# Patient Record
Sex: Male | Born: 1990 | Race: White | Hispanic: No | Marital: Single | Smoking: Current every day smoker
Health system: Southern US, Community
[De-identification: ages and names within clinical notes are randomized; demographics above are authoritative.]

## PROBLEM LIST (undated history)

## (undated) DIAGNOSIS — F1120 Opioid dependence, uncomplicated: Secondary | ICD-10-CM

## (undated) DIAGNOSIS — D649 Anemia, unspecified: Secondary | ICD-10-CM

## (undated) DIAGNOSIS — M779 Enthesopathy, unspecified: Secondary | ICD-10-CM

## (undated) HISTORY — PX: WISDOM TOOTH EXTRACTION: SHX21

---
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
## 2010-03-28 ENCOUNTER — Ambulatory Visit: Payer: Self-pay | Admitting: Internal Medicine

## 2010-03-28 DIAGNOSIS — D492 Neoplasm of unspecified behavior of bone, soft tissue, and skin: Secondary | ICD-10-CM | POA: Insufficient documentation

## 2010-03-28 DIAGNOSIS — J45909 Unspecified asthma, uncomplicated: Secondary | ICD-10-CM | POA: Insufficient documentation

## 2010-04-03 ENCOUNTER — Telehealth (INDEPENDENT_AMBULATORY_CARE_PROVIDER_SITE_OTHER): Payer: Self-pay | Admitting: *Deleted

## 2010-04-06 ENCOUNTER — Encounter (INDEPENDENT_AMBULATORY_CARE_PROVIDER_SITE_OTHER): Payer: Self-pay | Admitting: *Deleted

## 2010-06-02 ENCOUNTER — Ambulatory Visit: Payer: Self-pay | Admitting: Internal Medicine

## 2010-06-16 ENCOUNTER — Ambulatory Visit: Payer: Self-pay | Admitting: Family Medicine

## 2010-06-16 DIAGNOSIS — J029 Acute pharyngitis, unspecified: Secondary | ICD-10-CM | POA: Insufficient documentation

## 2010-06-16 LAB — CONVERTED CEMR LAB
Heterophile Ab Screen: POSITIVE
Rapid Strep: NEGATIVE

## 2010-06-21 ENCOUNTER — Encounter (INDEPENDENT_AMBULATORY_CARE_PROVIDER_SITE_OTHER): Payer: Self-pay | Admitting: *Deleted

## 2010-07-05 ENCOUNTER — Encounter: Payer: Self-pay | Admitting: Internal Medicine

## 2010-07-18 ENCOUNTER — Telehealth: Payer: Self-pay | Admitting: Internal Medicine

## 2010-09-10 ENCOUNTER — Telehealth: Payer: Self-pay | Admitting: Internal Medicine

## 2010-09-11 ENCOUNTER — Encounter (INDEPENDENT_AMBULATORY_CARE_PROVIDER_SITE_OTHER): Payer: Self-pay | Admitting: *Deleted

## 2010-09-19 ENCOUNTER — Ambulatory Visit: Payer: Self-pay | Admitting: Internal Medicine

## 2010-09-19 ENCOUNTER — Encounter (INDEPENDENT_AMBULATORY_CARE_PROVIDER_SITE_OTHER): Payer: Self-pay | Admitting: *Deleted

## 2010-09-19 ENCOUNTER — Telehealth (INDEPENDENT_AMBULATORY_CARE_PROVIDER_SITE_OTHER): Payer: Self-pay | Admitting: *Deleted

## 2010-11-21 NOTE — Assessment & Plan Note (Signed)
Summary: congested/cbs   Vital Signs:  Patient profile:   20 year old male Height:      74 inches Weight:      170 pounds O2 Sat:      91 % on Room air Temp:     98.3 degrees F oral Pulse rate:   96 / minute BP sitting:   120 / 82  (left arm)  Vitals Entered By: Jeremy Johann CMA (March 28, 2010 8:43 AM)  O2 Flow:  Room air CC: chest congestion, Comments --cough --chest tightness --difficulty breathing REVIEWED MED LIST, PATIENT AGREED DOSE AND INSTRUCTION CORRECT    History of Present Illness: developed  chest congestion, wheezing, shortness of breath and cough few days ago he is using his old albuterol nebulization and inhalers Symptoms somehow better today but he is still pretty tight  He just moved from [place] back to [place], while living there, they detected a tumor at the left knee, 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 Needs a followup. He has some knee pain, his grandma has been giving him Ultram that he takes p.r.n.  Allergies (verified): No Known Drug Allergies  Past History:  Past Medical History: Asthma L knee tumor (distal femur)  Past Surgical History: no major   Family History: colon ca-- no prostate ca--no  Social History: moved back to [place] 3-11 went to [employer] x 2 years) tobacco 1/2 ppd ETOH- sometimes Drugs -- no  Review of Systems General:  Denies fever; no muscle aches or fatigue his asthma symptoms have been well controlled, last respiratory problems almost a year ago. ENT:  some runny nose, no sore throat, no sinus congestion. GI:  Denies diarrhea, nausea, and vomiting.  Physical Exam  General:  alert, well-developed, and well-nourished.   Ears:  R ear normal and L ear normal.   Nose:  slightly congested Mouth:  no redness or discharge Lungs:  he has bilateral wheezing, prolonged expiration, few rhonchi. No acute distress Heart:  normal rate, regular rhythm, and no murmur.   Extremities:  no edema   Impression &  Recommendations:  Problem # 1:  DISEASE, ACUTE BRONCHOSPASM (ICD-519.11) Assessment New acute exacerbation of asthma Nebulization provided today, exam following the nebulization ---> improved. Good air movement , decreased wheezing, few rhonchi see Instructions  Problem # 2:  ASTHMA (ICD-493.90) symptoms well controlled except for very few exacerbations, see review of systems His updated medication list for this problem includes:    Albuterol Sulfate 0.63 Mg/80ml Nebu (Albuterol sulfate) .Marland Kitchen... As directed    Ventolin Hfa 108 (90 Base) Mcg/act Aers (Albuterol sulfate) .Marland Kitchen... 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 Was told he needs a followup x-ray. recommended  to bring the old records X-ray of the left knee Ortho  referral if   needed    Orders: T-Knee Comp Left 4 Views [phone])  Complete Medication List: 1)  Albuterol Sulfate 0.63 Mg/15ml Nebu (Albuterol sulfate) .... As directed 2)  Ventolin Hfa 108 (90 Base) Mcg/act Aers (Albuterol sulfate) .... 2 puffs every 4 hours as needed for wheezing 3)  Zithromax Z-pak 250 Mg Tabs (Azithromycin) .... As directed 4)  Prednisone 10 Mg Tabs (Prednisone) .Marland KitchenMarland KitchenMarland Kitchen  4 by mouth once daily x 2, 3x2, 2x2,1x2  Patient Instructions: 1)  rest, fluids, Tylenol, Robitussin-DM 2)  Albuterol nebulization or inhalers as needed for wheezing 3)  Antibiotics----Zithromax 4)  Prednisone as prescribed 5)  Call if not better in a few days, call anytime if you feel worse Prescriptions: PREDNISONE 10 MG TABS (PREDNISONE) 4 by mouth once daily x 2, 3x2, 2x2,1x2  #20 x 0   Entered and Authorized by:   Elita Quick E. Paz MD   Signed by:   Nolon Rod. Paz MD on 03/28/2010   Method used:    Print then Give to Patient   RxID:   [id number] ZITHROMAX Z-PAK 250 MG TABS (AZITHROMYCIN) as directed  #1 x 0   Entered and Authorized by:   Nolon Rod. Paz MD   Signed by:   Nolon Rod. Paz MD on 03/28/2010   Method used:   Print then Give to Patient   RxID:   [id number] VENTOLIN HFA 108 (90 BASE) MCG/ACT AERS (ALBUTEROL SULFATE) 2 puffs every 4 hours as needed for wheezing  #1 x 1   Entered and Authorized by:   Nolon Rod. Paz MD   Signed by:   Nolon Rod. Paz MD on 03/28/2010   Method used:   Print then Give to Patient   RxID:   [id number]    Medication Administration  Medication # 1:    Medication: Albuterol Sulfate Sol 1mg  unit dose    Diagnosis: ASTHMA (ICD-493.90)    Route: inhaled    Exp Date: 12/21/2010    Lot #: [id number]    Mfr: nephron pharmaceuticals     Comments: pt given 2.5/23ml    Patient tolerated medication without complications    Given by: Jeremy Johann CMA (March 28, 2010 9:22 AM)  Orders Added: 1)  Nebulizer Tx [25366] 2)  Albuterol Sulfate Sol 1mg  unit dose [J7613] 3)  T-Knee Comp Left 4 Views [73564TC] 4)  Est. Patient Level IV [44034]

## 2010-11-21 NOTE — Letter (Signed)
Summary: Primary Care Appointment Letter  [facility]  [address] [place], [place] [address]   Phone: [phone]  Fax: [phone]    09/11/2010 MRN: [id number]  [name] [address] [place], [place]  [address]  Dear Mr. [name],   Your Primary Care Physician [place] E. Paz MD has indicated that:    ___X____it is time to schedule an appointment. (Please contact our office, need to repeat left knee Xray)    _______you missed your appointment on______ and need to call and          reschedule.    _______you need to have lab work done.    _______you need to schedule an appointment discuss lab or test results.    _______you need to call to reschedule your appointment that is                       scheduled on _________.     Please call our office as soon as possible. Our phone number is [phone]_________. Please press option 1. Our office is open 8a-12noon and 1p-5p, Monday through Friday.     Thank you,    [place] Primary [facility] Scheduler

## 2010-11-21 NOTE — Letter (Signed)
Summary: Out of Work  [facility]  [address] [place], [place] [address]   Phone: [phone]  Fax: [phone]    June 16, 2010   Employee:  [name]    To Whom It May Concern:   For Medical reasons, please excuse the above named employee from work for the following dates:  Start:   8/25  End:   Once he is fever free for 24 hours.  He has mono and is not able to work until his temp is normal for 24 hours.  Once his temp is normal he may have difficulty completing a full shift due to the fatigue that mono causes.  Please be understanding during this illness.  If you need additional information, please feel free to contact our office.         Sincerely,    Neena Rhymes MD

## 2010-11-21 NOTE — Progress Notes (Signed)
Summary: needs a knee x-ray  Phone Note Outgoing Call   Summary of Call: please advise patient that we're still waiting for the x-ray of  his  knee. If he needs a new  order, please print another copy Jose E. Paz MD  April 03, 2010 10:49 AM   left message to call office.Marti Sleigh Deloach CMA  April 03, 2010 4:50 PM  left message to call  office................Marland KitchenFelecia Deloach CMA  April 04, 2010 9:30 AM  [facility] OFFICE.....................Marland KitchenFelecia Deloach CMA  April 05, 2010 10:27 AM  tried to call pt several time with no response letter mailed to pt to contact office  .............Marland KitchenFelecia Deloach CMA  April 06, 2010 2:30 PM

## 2010-11-21 NOTE — Letter (Signed)
Summary: Work Dietitian at [employer]  [address] [place], [place] [address]   Phone: [phone]  Fax: [phone]    Today's Date: September 19, 2010  Name of Patient: [name]  The above named patient had a medical visit today at:  10:15am / pm.  Please take this into consideration when reviewing the time away from work/school.    Special Instructions:  [ X ] None  [  ] To be off the remainder of today, returning to the normal work / school schedule tomorrow.  [  ] To be off until the next scheduled appointment on ______________________.  [  ] Other ________________________________________________________________ ________________________________________________________________________   Sincerely yours,   Shonna Chock CMA

## 2010-11-21 NOTE — Assessment & Plan Note (Signed)
Summary: tetnus, flu//lch  Nurse Visit  CC: TD and flu vacc/kb   Allergies: No Known Drug Allergies  Immunizations Administered:  Tetanus Vaccine:    Vaccine Type: Tdap    Site: left deltoid    Mfr: GlaxoSmithKline    Dose: 0.5 ml    Route: IM    Given by: Lucious Groves CMA    Exp. Date: 08/10/2012    Lot #: [id number]    VIS given: 09/08/08 version given September 19, 2010.  Orders Added: 1)  Admin 1st Vaccine [90471] 2)  Flu Vaccine 23yrs + [10932] 3)  Tdap => 50yrs IM [90715] 4)  Admin of Any Addtl Vaccine [90472]       Flu Vaccine Consent Questions     Do you have a history of severe allergic reactions to this vaccine? no    Any prior history of allergic reactions to egg and/or gelatin? no    Do you have a sensitivity to the preservative Thimersol? no    Do you have a past history of Guillan-Barre Syndrome? no    Do you currently have an acute febrile illness? no    Have you ever had a severe reaction to latex? no    Vaccine information given and explained to patient? yes    Are you currently pregnant? no    Lot Number:[id number]   Exp Date:04/21/2011   Site Given  Right Deltoid IMu

## 2010-11-21 NOTE — Letter (Signed)
Summary: Generic Letter  [facility]  [address] [place], [place] [address]   Phone: [phone]  Fax: [phone]    04/06/2010  SHERIDAN GETTEL [address] [place], [place]  [address]  Dear Mr. Pierro,  We have tried several times to contact you with no response. Please give [place] a call at [phone] to discuss this matter.         Sincerely,       Jose Paz,MD

## 2010-11-21 NOTE — Progress Notes (Signed)
Summary: knee XR  Phone Note Outgoing Call   Summary of Call: please call patient : need repeated L Knee XR if unable to communicate, send letter [facility] Grayling E. Paz MD  September 10, 2010 12:15 PM   Follow-up for Phone Call        Mailbox full, will send letter.  Follow-up by: Army Fossa CMA,  September 11, 2010 8:47 AM

## 2010-11-21 NOTE — Progress Notes (Signed)
Summary: refill  Phone Note Refill Request Call back at [phone] Message from:  Patient on September 19, 2010 10:39 AM  Refills Requested: Medication #1:  ALBUTEROL SULFATE 0.63 MG/3ML NEBU as directed   Dosage confirmed as above?Dosage Confirmed   Supply Requested: 1 month [facility]  Next Appointment Scheduled: none Initial call taken by: Lavell Islam,  September 19, 2010 10:40 AM    Prescriptions: ALBUTEROL SULFATE 0.63 MG/3ML NEBU (ALBUTEROL SULFATE) as directed  #1 month x 3   Entered by:   Army Fossa CMA   Authorized by:   Nolon Rod. Paz MD   Signed by:   Army Fossa CMA on 09/19/2010   Method used:   Electronically to        [facility]* (retail)       [address]       [place], [place]  [id number]       Ph: [phone]       Fax: [phone]   RxID:   [id number]

## 2010-11-21 NOTE — Letter (Signed)
Summary: Patient Will Not Return Calls/[facility]  Patient Will Not Return Calls/[facility]   Imported By: Lanelle Bal 07/25/2010 13:12:31  _____________________________________________________________________  External Attachment:    Type:   Image     Comment:   External Document

## 2010-11-21 NOTE — Progress Notes (Signed)
Summary: ORTHOPAEDIC REFERRAL  Phone Note Other Incoming   Summary of Call: IN REFERENCE TO ORTHOPAEDIC REFERRAL........Marland KitchenPER FAX FROM [facility], PATIENT WILL NOT RETURN THEIR CALLS Magdalen Spatz [facility]  July 05, 2010 10:48 AM  I HAVE LM FOR PT TO CALL ME ABOUT [facility] HIS APPT.  Initial call taken by: Magdalen Spatz [facility],  July 18, 2010 4:36 PM

## 2010-11-21 NOTE — Assessment & Plan Note (Signed)
Summary: SORE THROAT/FEVER/THROWING UP//KN   Vital Signs:  Patient profile:   20 year old male Height:      74 inches Weight:      170 pounds BMI:     21.91 Temp:     101.3 degrees F oral BP sitting:   112 / 70  (left arm)  Vitals Entered By: Doristine Devoid CMA (June 16, 2010 9:00 AM) CC: sore throat, vomiting and fever x2 days    History of Present Illness: [name] here today for sore throat, fever, and vomiting.  also wheezing, hx of asthma.  sxs started yesterday.  no known sick contacts.  vomited x3.  took grandmother's left over Zofran.  unable to eat due to pain of sore throat.  Tmax unknown- fever subjective at home.  works in [employer]nel [employer].  denies abd pain  Current Medications (verified): 1)  Albuterol Sulfate 0.63 Mg/24ml Nebu (Albuterol Sulfate) .... As Directed 2)  Ventolin Hfa 108 (90 Base) Mcg/act Aers (Albuterol Sulfate) .... 2 Puffs Every 4 Hours As Needed For Wheezing  Allergies (verified): No Known Drug Allergies  Past History:  Past Medical History: Last updated: 03/28/2010 Asthma L knee tumor (distal femur)  Social History: Last updated: 03/28/2010 moved back to [place] 3-11 went to [employer] x 2 years) tobacco 1/2 ppd ETOH- sometimes Drugs -- no  Review of Systems      See HPI  Physical Exam  General:  alert, well-developed, and well-nourished.   Head:  NCAT, no TTP over sinuses Eyes:  no injxn or inflammation Ears:  R ear normal and L ear normal.   Nose:  slightly congested Mouth:  marked tonsilar edema, erythema and exudates.  has muffled voice Neck:  mild ant LAD Lungs:  scattered expiratory wheezes Heart:  normal rate, regular rhythm, and no murmur.   Abdomen:  no hepatosplenomegaly, NT/ND, +BS   Impression & Recommendations:  Problem # 1:  SORE THROAT (ICD-462) Assessment New pt w/ + monospot.  start steroids for marked tonsilar enlargement.  Zofran as needed for nausea.  reviewed dx and supportive care.  note provided for  work. The following medications were removed from the medication list:    Zithromax Z-pak 250 Mg Tabs (Azithromycin) .Marland Kitchen... As directed  Orders: Rapid Strep (04540) Monospot (98119) Depo- Medrol 80mg  (J1040) Admin of Therapeutic Inj  intramuscular or subcutaneous (14782)  Complete Medication List: 1)  Albuterol Sulfate 0.63 Mg/55ml Nebu (Albuterol sulfate) .... As directed 2)  Ventolin Hfa 108 (90 Base) Mcg/act Aers (Albuterol sulfate) .... 2 puffs every 4 hours as needed for wheezing 3)  Prednisone 20 Mg Tabs (Prednisone) .... 2 tabs by mouth daily x10 days 4)  Zofran 8 Mg Tabs (Ondansetron hcl) .Marland Kitchen.. 1 by mouth q 8h prn  Patient Instructions: 1)  Follow up as needed 2)  This is mono and will take a while for you to feel better 3)  Get the rest that your body needs 4)  NO contact sports 5)  Take the Prednisone to help your sore throat and wheezing 6)  Use the Zofran as needed for nausea/vomiting 7)  Tylenol/Ibuprofen as needed for pain/fever 8)  Hang in there!! Prescriptions: ZOFRAN 8 MG  TABS (ONDANSETRON HCL) 1 by mouth q 8h prn  #45 x 0   Entered and Authorized by:   Neena Rhymes MD   Signed by:   Neena Rhymes MD on 06/16/2010   Method used:   Electronically to  [facility]* (retail)       [address]       [place], [place]  [id number]       Ph: [phone]       Fax: [phone]   RxID:   [id number] VENTOLIN HFA 108 (90 BASE) MCG/ACT AERS (ALBUTEROL SULFATE) 2 puffs every 4 hours as needed for wheezing  #1 x 3   Entered and Authorized by:   Neena Rhymes MD   Signed by:   Neena Rhymes MD on 06/16/2010   Method used:   Electronically to        [facility]* (retail)       [address]       [place], [place]  [address]       Ph: [phone]       Fax: [phone]   RxID:   [id number] PREDNISONE 20 MG TABS (PREDNISONE) 2 tabs by mouth daily x10 days  #20 x 0   Entered and Authorized by:   Neena Rhymes  MD   Signed by:   Neena Rhymes MD on 06/16/2010   Method used:   Electronically to        [facility]* (retail)       [address]       [place], [place]  [address]       Ph: [phone]       Fax: [phone]   RxID:   [id number]   Laboratory Results   Blood Tests      Mono: positive   Other Tests  Rapid Strep: negative  Kit Test Internal QC: Positive   (Normal Range: Negative)    Medication Administration  Injection # 1:    Medication: Depo- Medrol 80mg     Diagnosis: SORE THROAT (ICD-462)    Route: IM    Site: R deltoid    Exp Date: 08/22/2010    Lot #: [id number]    Mfr: [employer]    Given by: Doristine Devoid CMA (June 16, 2010 9:44 AM)  Orders Added: 1)  Rapid Strep [00938] 2)  Monospot [18299] 3)  Depo- Medrol 80mg  [J1040] 4)  Admin of Therapeutic Inj  intramuscular or subcutaneous [96372] 5)  Est. Patient Level III [37169]

## 2010-11-21 NOTE — Letter (Signed)
Summary: Generic Letter  [facility] [place]  [address] [place], [place] [address]   Phone: [phone]  Fax: [phone]    06/21/2010  [name] [address] [place], [place]  [address]  Dear Mr. Larch,   We have made mutiple attempts to get in contact with you. It seems the number we have goes straight a mailbox that is full. Please contact us with a new number to be able to reach you. We have some information we need to share you with. We also have some information you need to pick up for your futhure orthopeadic appointment. Please call us back at [phone] as soon as you get this letter. Thanks.      Sincerely,   Harold Barban

## 2011-02-18 ENCOUNTER — Emergency Department (HOSPITAL_COMMUNITY)
Admission: EM | Admit: 2011-02-18 | Discharge: 2011-02-19 | Disposition: A | Discharge status: Other Acute Inpatient Hospital | Payer: PRIVATE HEALTH INSURANCE | Attending: Emergency Medicine | Admitting: Emergency Medicine

## 2011-02-18 DIAGNOSIS — F1210 Cannabis abuse, uncomplicated: Secondary | ICD-10-CM | POA: Insufficient documentation

## 2011-02-18 DIAGNOSIS — F17200 Nicotine dependence, unspecified, uncomplicated: Secondary | ICD-10-CM | POA: Insufficient documentation

## 2011-02-18 DIAGNOSIS — R110 Nausea: Secondary | ICD-10-CM | POA: Insufficient documentation

## 2011-02-18 DIAGNOSIS — F1110 Opioid abuse, uncomplicated: Secondary | ICD-10-CM | POA: Insufficient documentation

## 2011-02-18 LAB — COMPREHENSIVE METABOLIC PANEL
ALT: 59 U/L — ABNORMAL HIGH (ref 0–53)
AST: 42 U/L — ABNORMAL HIGH (ref 0–37)
Albumin: 3.3 g/dL — ABNORMAL LOW (ref 3.5–5.2)
Alkaline Phosphatase: 105 U/L (ref 39–117)
BUN: 9 mg/dL (ref 6–23)
CO2: 32 mEq/L (ref 19–32)
Calcium: 9.5 mg/dL (ref 8.4–10.5)
Chloride: 102 mEq/L (ref 96–112)
Creatinine, Ser: 0.83 mg/dL (ref 0.4–1.5)
GFR calc Af Amer: >60 mL/min (ref >60)
GFR calc non Af Amer: >60 mL/min (ref >60)
Glucose, Bld: 94 mg/dL (ref 70–99)
Potassium: 4.7 mEq/L (ref 3.5–5.1)
Sodium: 140 mEq/L (ref 135–145)
Total Bilirubin: 0.5 mg/dL (ref 0.3–1.2)
Total Protein: 7.1 g/dL (ref 6.0–8.3)

## 2011-02-18 LAB — CBC
HCT: 39.5 % (ref 39.0–52.0)
Hemoglobin: 13.7 g/dL (ref 13.0–17.0)
MCH: 28.9 pg (ref 26.0–34.0)
MCHC: 34.7 g/dL (ref 30.0–36.0)
MCV: 83.3 fL (ref 78.0–100.0)
Platelets: 171 10*3/uL (ref 150–400)
RBC: 4.74 MIL/uL (ref 4.22–5.81)
RDW: 11.8 % (ref 11.5–15.5)
WBC: 5.2 10*3/uL (ref 4.0–10.5)

## 2011-02-18 LAB — RAPID URINE DRUG SCREEN, HOSP PERFORMED
Amphetamines: NOT DETECTED
Benzodiazepines: POSITIVE — AB
Cocaine: NOT DETECTED

## 2011-02-18 LAB — DIFFERENTIAL
Basophils Absolute: 0 10*3/uL (ref 0.0–0.1)
Basophils Relative: 0 % (ref 0–1)
Eosinophils Absolute: 0.2 10*3/uL (ref 0.0–0.7)
Eosinophils Relative: 3 % (ref 0–5)
Lymphocytes Relative: 29 % (ref 12–46)
Lymphs Abs: 1.5 10*3/uL (ref 0.7–4.0)
Monocytes Absolute: 0.4 10*3/uL (ref 0.1–1.0)
Monocytes Relative: 8 % (ref 3–12)
Neutro Abs: 3.1 10*3/uL (ref 1.7–7.7)
Neutrophils Relative %: 60 % (ref 43–77)

## 2011-02-18 LAB — ETHANOL: Alcohol, Ethyl (B): <5 mg/dL (ref 0–10)

## 2011-02-19 ENCOUNTER — Inpatient Hospital Stay (HOSPITAL_COMMUNITY)
Admission: AD | Admit: 2011-02-19 | Discharge: 2011-02-22 | DRG: 897 | Disposition: A | Discharge status: Home / Self Care | Payer: PRIVATE HEALTH INSURANCE | Source: Ambulatory Visit | Attending: Psychiatry | Admitting: Psychiatry

## 2011-02-19 DIAGNOSIS — F1120 Opioid dependence, uncomplicated: Principal | ICD-10-CM

## 2011-02-19 DIAGNOSIS — F329 Major depressive disorder, single episode, unspecified: Secondary | ICD-10-CM

## 2011-02-19 DIAGNOSIS — F411 Generalized anxiety disorder: Secondary | ICD-10-CM

## 2011-02-19 DIAGNOSIS — Z818 Family history of other mental and behavioral disorders: Secondary | ICD-10-CM

## 2011-02-19 DIAGNOSIS — F1910 Other psychoactive substance abuse, uncomplicated: Secondary | ICD-10-CM

## 2011-02-19 DIAGNOSIS — F3289 Other specified depressive episodes: Secondary | ICD-10-CM

## 2011-02-19 DIAGNOSIS — M25539 Pain in unspecified wrist: Secondary | ICD-10-CM

## 2011-02-19 DIAGNOSIS — I252 Old myocardial infarction: Secondary | ICD-10-CM

## 2011-02-19 DIAGNOSIS — M779 Enthesopathy, unspecified: Secondary | ICD-10-CM

## 2011-02-19 DIAGNOSIS — J45909 Unspecified asthma, uncomplicated: Secondary | ICD-10-CM

## 2011-02-19 DIAGNOSIS — Z6379 Other stressful life events affecting family and household: Secondary | ICD-10-CM

## 2011-02-20 DIAGNOSIS — F1120 Opioid dependence, uncomplicated: Secondary | ICD-10-CM

## 2011-02-21 NOTE — H&P (Signed)
NAMEJAREE, [name] NO.:  [id number]  MEDICAL RECORD NO.:  [id number]           PATIENT TYPE:  I  LOCATION:  0304                          FACILITY:  [facility]  PHYSICIAN:  Anselm Jungling, MD  DATE OF BIRTH:  [birth year]  DATE OF ADMISSION:  02/19/2011 DATE OF DISCHARGE:                      PSYCHIATRIC ADMISSION ASSESSMENT   8:35 a.m.  IDENTIFICATION:  20 year old Caucasian male, single.  This is a voluntary admission.  HISTORY OF THE PRESENT ILLNESS:  This is the first inpatient admission for [name], a pleasant single 20 year old male who took himself to an urgent care yesterday and was encouraged to come for detox.  He agrees and is anxious to become completely abstinent from opiates.  He has been using opiates since age 41 when he was typically snorting Roxicodone. He began using heroin about six months ago and now is typically shooting heroin IV up to three bags a day or 3 pills of Roxicodone (30 mg each) daily.  He is expressing a desire to be abstinent and get his life together.  He has been experiencing blackouts in conjunction with his opiate use and does not remember his recent 20 year old birthday on February 01, 2013.  He denies a history of seizures.  PAST PSYCHIATRIC HISTORY:  No previous treatment.  This is his first inpatient admission.  No previous experience with detox or drug rehab. He began using marijuana at the age of 54 which he continues to use on a fairly regular basis.  He began snorting cocaine at age 72 and used it up to age 67.  He made the decision to become abstinent and has not relapsed.  He has been told by his family that they think he has attention deficit but has never been diagnosed.  Denies a history of physical or emotional abuse.  No history of suicide attempts and denies a history of suicidal thoughts.  He denies abusing alcohol.  SOCIAL HISTORY:  He completed several credits at [employer] and at [employer].  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  He has been living on his own with his two dogs until he was unable to maintain his household.  He now plans to move in with his father who has assumed the care of his pets until [name] attains abstinence from drugs.  He denies any legal charges. He has one young daughter from a previous relationship and is attempting to regain custody privileges.  No legal charges.  FAMILY HISTORY:  Brother with history of substance abuse.  Mother with possible mood disorder.  Paternal grandfather completed suicide by a gunshot wound.  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  His past medical history is significant for asthma.  No history of seizures.  No history of surgeries.  Previous hospitalizations are none.  CURRENT MEDICATIONS:  He is occasionally taking his mother's Klonopin 0.1 mg or a Xanax of hers periodically.  He is prescribed tramadol for his joint aches and uses an occasional albuterol inhaler.  DRUG ALLERGIES:  None.  Physical exam was done in the  emergency room and is noted in the record. This is a tall, slim built Caucasian male in no acute distress. Admitting vital signs:  Temperature 98.2, pulse 76, respirations 20, blood pressure 104/68.  Motor is smooth.  No abnormal movements.  Fully alert and healthy-appearing with notable tracts marks on the dorsal surfaces of his hands, some on his wrists, and in both antecubital fossa that are noninflamed.  CBC:  WBC 5.2, hemoglobin 13.7, hematocrit 39.5, platelets 171,000. Normal chemistry.  BUN 9, creatinine 0.83.  Hepatic function:  SGOT 42, SGPT 59, alkaline phosphatase 105, total bilirubin is 0.5.  Urine drug screen positive for benzodiazepines, opiates, and cannabis metabolites. Alcohol screen negative.  MENTAL STATUS EXAM:  Fully alert male, pleasant, cooperative, in  full contact with reality.  Affect appropriate, cooperative.  Asking for help getting off the opiates.  He is quite determined, being proud of the fact that he has been able to maintain abstinence from cocaine and wants to achieve similar long-term abstinence from opiates.  He admits to some mood fluctuations but denies any suicidal thoughts.  Cognition fully intact.  AXIS I:  Opiate dependence. AXIS II:  No diagnosis. AXIS III:  Asthma, chronic right wrist aches of unclear etiology, and tendonitis in his lower leg not otherwise specified. AXIS IV:  Parenting stressors. AXIS V:  Current is 42, past year not known.  PLAN:  Voluntarily admit him with a goal of a safe detox in five days. We started him on a clonidine detox protocol, and because of his wrist aches will give him some Naprosyn 500 mg b.i.d.  He declines the offer of an albuterol inhaler and feels he does not need it while he is here. He had stopped using 24 hours prior to admission and has experienced some shaking, sweating, and mild muscle cramping, and states these are typical withdrawal symptoms for him.  He is not displaying any overt symptoms today.     Margaret A. Lorin Picket, N.P.   ______________________________ Anselm Jungling, MD    MAS/MEDQ  D:  02/20/2011  T:  02/20/2011  Job:  [id number]  Electronically Signed by Kari Baars N.P. on 02/20/2011 01:23:11 PM Electronically Signed by Geralyn Flash MD on 02/21/2011 12:14:59 PM

## 2011-02-23 NOTE — Discharge Summary (Signed)
  NAMECOTTON, BECKLEY NO.:  [id number]  MEDICAL RECORD NO.:  [id number]           PATIENT TYPE:  I  LOCATION:  0304                          FACILITY:  [facility]  PHYSICIAN:  Anselm Jungling, MD  DATE OF BIRTH:  09-Sep-1991  DATE OF ADMISSION:  02/19/2011 DATE OF DISCHARGE:  02/22/2011                              DISCHARGE SUMMARY   IDENTIFYING DATA/REASON FOR ADMISSION:  This was the first [facility] admission for [name], a 20 year old single Caucasian male who was admitted for treatment of polysubstance dependence.  Please refer to the admission note for further details pertaining to the symptoms, circumstances and history that led to his hospitalization.  He was given an initial Axis I diagnosis of polysubstance dependence.  MEDICAL AND LABORATORY:  He was medically and physically assessed by the psychiatric nurse practitioner.  He was in good health without any active or chronic medical problems.  There were no significant medical issues during his stay.  HOSPITAL COURSE:  The patient was admitted to the adult inpatient psychiatric service.  He presented as a well-nourished, normally- developed adult young adult male who was generally very pleasant and cooperative.  He slept most of the first day, but was a good participant following this.  He was placed on a clonidine protocol for detoxification purposes.  He elected to discontinue taking clonidine on hospital day #3 and reported that following doing this, he felt better, with more energy and better mood.  He participated in therapeutic groups and activities geared towards acquiring better coping skills, a better understanding of his underlying disorder and dynamics, and the development of an aftercare plan.  Also, he participated in groups geared towards 12-step recovery.  On hospital day #4, he requested discharge.  The suicide risk assessment was completed and there was a rating of minimal risk, and no  apparent barriers to discharge.  He agreed to the following aftercare plan.  AFTERCARE:  The patient was to follow-up by going to 90 NA meetings in 90 days.  Also, he was referred to [name], substance abuse counselor, with an intake appointment on May 17 at 2:00 p.m.  DISCHARGE MEDICATIONS:  None.  DISCHARGE DIAGNOSES:  AXIS I:  Polysubstance dependence. AXIS II:  Deferred. AXIS III:  No acute or chronic illnesses, history of myocardial infarction. AXIS IV:  Stressors severe. AXIS V:  GAF on discharge 50.     Anselm Jungling, MD     SPB/MEDQ  D:  02/22/2011  T:  02/22/2011  Job:  [id number]  Electronically Signed by Geralyn Flash MD on 02/23/2011 09:03:16 AM

## 2011-07-10 ENCOUNTER — Telehealth: Payer: Self-pay | Admitting: Internal Medicine

## 2011-07-10 ENCOUNTER — Other Ambulatory Visit: Payer: Self-pay | Admitting: Family Medicine

## 2011-07-10 NOTE — Telephone Encounter (Signed)
Needs office visit.

## 2011-07-11 NOTE — Telephone Encounter (Signed)
Called pt he states not able to come in he has school & work. Will continue taking ovc meds if not better than will call back for appt.Marland KitchenMarland KitchenMarland Kitchen9/19/12@10 :08am/LMB

## 2011-07-13 NOTE — Telephone Encounter (Signed)
Denied, see recent phone note

## 2011-09-06 ENCOUNTER — Emergency Department (HOSPITAL_COMMUNITY): Payer: BC Managed Care – PPO

## 2011-09-06 ENCOUNTER — Inpatient Hospital Stay (HOSPITAL_COMMUNITY)
Admission: EM | Admit: 2011-09-06 | Discharge: 2011-09-08 | DRG: 096 | Disposition: A | Discharge status: Home / Self Care | Payer: BC Managed Care – PPO | Attending: Family Medicine | Admitting: Family Medicine

## 2011-09-06 DIAGNOSIS — E876 Hypokalemia: Secondary | ICD-10-CM

## 2011-09-06 DIAGNOSIS — J45901 Unspecified asthma with (acute) exacerbation: Principal | ICD-10-CM

## 2011-09-06 DIAGNOSIS — R739 Hyperglycemia, unspecified: Secondary | ICD-10-CM

## 2011-09-06 DIAGNOSIS — E139 Other specified diabetes mellitus without complications: Secondary | ICD-10-CM | POA: Diagnosis present

## 2011-09-06 DIAGNOSIS — F1120 Opioid dependence, uncomplicated: Secondary | ICD-10-CM | POA: Diagnosis present

## 2011-09-06 DIAGNOSIS — T380X5A Adverse effect of glucocorticoids and synthetic analogues, initial encounter: Secondary | ICD-10-CM | POA: Diagnosis present

## 2011-09-06 DIAGNOSIS — E1110 Type 2 diabetes mellitus with ketoacidosis without coma: Secondary | ICD-10-CM

## 2011-09-06 DIAGNOSIS — F17200 Nicotine dependence, unspecified, uncomplicated: Secondary | ICD-10-CM | POA: Diagnosis present

## 2011-09-06 DIAGNOSIS — E119 Type 2 diabetes mellitus without complications: Secondary | ICD-10-CM

## 2011-09-06 HISTORY — DX: Enthesopathy, unspecified: M77.9

## 2011-09-06 HISTORY — DX: Opioid dependence, uncomplicated: F11.20

## 2011-09-06 MED ORDER — ALBUTEROL SULFATE (5 MG/ML) 0.5% IN NEBU
INHALATION_SOLUTION | RESPIRATORY_TRACT | Status: AC
  Administered 2011-09-06: 19:00:00 via RESPIRATORY_TRACT
  Filled 2011-09-06: qty 1

## 2011-09-06 MED ORDER — IPRATROPIUM BROMIDE 0.02 % IN SOLN
RESPIRATORY_TRACT | Status: AC
  Administered 2011-09-06: 19:00:00
  Filled 2011-09-06: qty 2.5

## 2011-09-06 MED ORDER — HYDROCOD POLST-CHLORPHEN POLST 10-8 MG/5ML PO LQCR
5.0000 mL | Freq: Once | ORAL | Status: AC
  Administered 2011-09-06: 5 mL via ORAL
  Filled 2011-09-06: qty 5

## 2011-09-06 MED ORDER — ALBUTEROL SULFATE (5 MG/ML) 0.5% IN NEBU
INHALATION_SOLUTION | RESPIRATORY_TRACT | Status: AC
  Administered 2011-09-06: 20:00:00 via RESPIRATORY_TRACT
  Filled 2011-09-06: qty 2

## 2011-09-06 MED ORDER — ALBUTEROL SULFATE (5 MG/ML) 0.5% IN NEBU
5.0000 mg | INHALATION_SOLUTION | Freq: Once | RESPIRATORY_TRACT | Status: AC
  Administered 2011-09-06: 5 mg via RESPIRATORY_TRACT
  Filled 2011-09-06: qty 1

## 2011-09-06 MED ORDER — ALBUTEROL (5 MG/ML) CONTINUOUS INHALATION SOLN
15.0000 mg | INHALATION_SOLUTION | Freq: Once | RESPIRATORY_TRACT | Status: AC
  Administered 2011-09-06: 15 mg via RESPIRATORY_TRACT
  Filled 2011-09-06: qty 20

## 2011-09-06 MED ORDER — METHYLPREDNISOLONE SODIUM SUCC 125 MG IJ SOLR
INTRAMUSCULAR | Status: AC
  Administered 2011-09-06: 19:00:00
  Filled 2011-09-06: qty 2

## 2011-09-06 NOTE — ED Notes (Addendum)
Pt brought by ems with c/o DIB, hx of asthma pt took HHN put did not help.pt received 10 mg albuterol, 5 mg of atrovent , 125 mg solumedrol from ems.

## 2011-09-06 NOTE — ED Notes (Signed)
[id number]<AV> Expected date:09/06/11<BR> Expected time: 6:15 PM<BR> Means of arrival:Ambulance<BR> Comments:<BR> EMS 51 GC 20 yom asthma

## 2011-09-06 NOTE — ED Provider Notes (Signed)
Medical screening examination/treatment/procedure(s) were conducted as a shared visit with non-physician practitioner(s) and myself.  I personally evaluated the patient during the encounter 20 year old male, smoker with known asthma, presents to the emergency department with a nonproductive cough, and severe shortness of breath and wheezing.  He had run out of his nebulizers.  He called EMS and was given nebulizers en route.  He still arrived in severe respiratory distress.  He continues to have diffuse bilateral wheezing.  We will give him an hour long nebulizer treatment with albuterol.  His chest x-ray is negative.  However, if he can use to wheeze.  I anticipate, that he'll need admission.  Nicholes Stairs, MD 09/06/11 [phone]

## 2011-09-06 NOTE — ED Notes (Signed)
Family at bedside. Eating.

## 2011-09-07 ENCOUNTER — Encounter (HOSPITAL_COMMUNITY): Payer: Self-pay | Admitting: Family Medicine

## 2011-09-07 DIAGNOSIS — R739 Hyperglycemia, unspecified: Secondary | ICD-10-CM

## 2011-09-07 DIAGNOSIS — E876 Hypokalemia: Secondary | ICD-10-CM

## 2011-09-07 DIAGNOSIS — J45901 Unspecified asthma with (acute) exacerbation: Secondary | ICD-10-CM

## 2011-09-07 LAB — DIFFERENTIAL
Basophils Absolute: 0 10*3/uL (ref 0.0–0.1)
Basophils Relative: 0 % (ref 0–1)
Eosinophils Absolute: 0 10*3/uL (ref 0.0–0.7)
Eosinophils Relative: 0 % (ref 0–5)
Lymphocytes Relative: 5 % — ABNORMAL LOW (ref 12–46)
Lymphs Abs: 0.5 10*3/uL — ABNORMAL LOW (ref 0.7–4.0)
Monocytes Absolute: 0.2 10*3/uL (ref 0.1–1.0)
Monocytes Relative: 2 % — ABNORMAL LOW (ref 3–12)
Neutro Abs: 8 10*3/uL — ABNORMAL HIGH (ref 1.7–7.7)
Neutrophils Relative %: 93 % — ABNORMAL HIGH (ref 43–77)

## 2011-09-07 LAB — COMPREHENSIVE METABOLIC PANEL
ALT: 11 U/L (ref 0–53)
AST: 19 U/L (ref 0–37)
Albumin: 3 g/dL — ABNORMAL LOW (ref 3.5–5.2)
Alkaline Phosphatase: 86 U/L (ref 39–117)
BUN: 10 mg/dL (ref 6–23)
CO2: 19 mEq/L (ref 19–32)
Calcium: 9.7 mg/dL (ref 8.4–10.5)
Chloride: 94 mEq/L — ABNORMAL LOW (ref 96–112)
Creatinine, Ser: 0.95 mg/dL (ref 0.50–1.35)
GFR calc Af Amer: >90 mL/min (ref >90)
GFR calc non Af Amer: >90 mL/min (ref >90)
Glucose, Bld: 335 mg/dL — ABNORMAL HIGH (ref 70–99)
Potassium: 2.4 mEq/L — CL (ref 3.5–5.1)
Sodium: 133 mEq/L — ABNORMAL LOW (ref 135–145)
Total Bilirubin: 0.2 mg/dL — ABNORMAL LOW (ref 0.3–1.2)
Total Protein: 7.3 g/dL (ref 6.0–8.3)

## 2011-09-07 LAB — BLOOD GAS, ARTERIAL
Acid-Base Excess: 0.4 mmol/L (ref 0.0–2.0)
Bicarbonate: 23.8 mEq/L (ref 20.0–24.0)
Drawn by: 347621
O2 Content: 2 L/min
O2 Saturation: 91.9 %
Patient temperature: 98.6
TCO2: 21.5 mmol/L (ref 0–100)
pCO2 arterial: 35.7 mmHg (ref 35.0–45.0)
pH, Arterial: 7.439 (ref 7.350–7.450)
pO2, Arterial: 61.3 mmHg — ABNORMAL LOW (ref 80.0–100.0)

## 2011-09-07 LAB — CBC
HCT: 36.7 % — ABNORMAL LOW (ref 39.0–52.0)
Hemoglobin: 12.5 g/dL — ABNORMAL LOW (ref 13.0–17.0)
MCH: 28 pg (ref 26.0–34.0)
MCHC: 34.1 g/dL (ref 30.0–36.0)
MCV: 82.3 fL (ref 78.0–100.0)
Platelets: 210 10*3/uL (ref 150–400)
RBC: 4.46 MIL/uL (ref 4.22–5.81)
RDW: 12.4 % (ref 11.5–15.5)
WBC: 8.6 10*3/uL (ref 4.0–10.5)

## 2011-09-07 LAB — GLUCOSE, CAPILLARY
Glucose-Capillary: 110 mg/dL — ABNORMAL HIGH (ref 70–99)
Glucose-Capillary: 136 mg/dL — ABNORMAL HIGH (ref 70–99)

## 2011-09-07 LAB — HEMOGLOBIN A1C: Mean Plasma Glucose: 108 mg/dL (ref <117)

## 2011-09-07 LAB — LIPASE, BLOOD: Lipase: 10 U/L — ABNORMAL LOW (ref 11–59)

## 2011-09-07 MED ORDER — PREDNISONE 20 MG PO TABS: 40.0000 mg | ORAL_TABLET | Freq: Every day | ORAL | Status: DC

## 2011-09-07 MED ORDER — POTASSIUM CHLORIDE 10 MEQ/100ML IV SOLN
10.0000 meq | Freq: Once | INTRAVENOUS | Status: AC
  Administered 2011-09-07: 10 meq via INTRAVENOUS
  Filled 2011-09-07: qty 100

## 2011-09-07 MED ORDER — PNEUMOCOCCAL VAC POLYVALENT 25 MCG/0.5ML IJ INJ
0.5000 mL | INJECTION | INTRAMUSCULAR | Status: AC
  Administered 2011-09-08: 0.5 mL via INTRAMUSCULAR
  Filled 2011-09-07: qty 0.5

## 2011-09-07 MED ORDER — SODIUM CHLORIDE 0.9 % IV SOLN
INTRAVENOUS | Status: DC
  Administered 2011-09-07: 10:00:00 via INTRAVENOUS

## 2011-09-07 MED ORDER — POTASSIUM CHLORIDE CRYS ER 20 MEQ PO TBCR
40.0000 meq | EXTENDED_RELEASE_TABLET | Freq: Four times a day (QID) | ORAL | Status: DC
  Administered 2011-09-07 – 2011-09-08 (×4): 40 meq via ORAL
  Filled 2011-09-07 (×7): qty 2

## 2011-09-07 MED ORDER — ONDANSETRON HCL 4 MG/2ML IJ SOLN: 4.0000 mg | Freq: Four times a day (QID) | INTRAMUSCULAR | Status: DC | PRN

## 2011-09-07 MED ORDER — SODIUM CHLORIDE 0.9 % IV BOLUS (SEPSIS)
1000.0000 mL | Freq: Once | INTRAVENOUS | Status: AC
  Administered 2011-09-07: 1000 mL via INTRAVENOUS

## 2011-09-07 MED ORDER — SODIUM CHLORIDE 0.9 % IV SOLN
INTRAVENOUS | Status: DC
  Filled 2011-09-07: qty 1

## 2011-09-07 MED ORDER — ALBUTEROL SULFATE HFA 108 (90 BASE) MCG/ACT IN AERS: 2.0000 | INHALATION_SPRAY | RESPIRATORY_TRACT | Status: DC | PRN

## 2011-09-07 MED ORDER — POTASSIUM CHLORIDE CRYS ER 20 MEQ PO TBCR
40.0000 meq | EXTENDED_RELEASE_TABLET | Freq: Once | ORAL | Status: AC
  Administered 2011-09-07: 40 meq via ORAL
  Filled 2011-09-07: qty 2
  Filled 2011-09-07: qty 1

## 2011-09-07 MED ORDER — ALUM & MAG HYDROXIDE-SIMETH 200-200-20 MG/5ML PO SUSP: 30.0000 mL | Freq: Four times a day (QID) | ORAL | Status: DC | PRN

## 2011-09-07 MED ORDER — DEXTROSE 5 % IV SOLN
500.0000 mg | Freq: Once | INTRAVENOUS | Status: AC
  Administered 2011-09-07: 500 mg via INTRAVENOUS
  Filled 2011-09-07: qty 500

## 2011-09-07 MED ORDER — ZOLPIDEM TARTRATE 5 MG PO TABS: 5.0000 mg | ORAL_TABLET | Freq: Every evening | ORAL | Status: DC | PRN

## 2011-09-07 MED ORDER — DM-GUAIFENESIN ER 30-600 MG PO TB12
1.0000 | ORAL_TABLET | Freq: Two times a day (BID) | ORAL | Status: DC | PRN
  Filled 2011-09-07: qty 1

## 2011-09-07 MED ORDER — NICOTINE 21 MG/24HR TD PT24
21.0000 mg | MEDICATED_PATCH | Freq: Every day | TRANSDERMAL | Status: DC
  Administered 2011-09-07 – 2011-09-08 (×2): 21 mg via TRANSDERMAL
  Filled 2011-09-07 (×2): qty 1

## 2011-09-07 MED ORDER — CEFTRIAXONE SODIUM 1 G IJ SOLR
1.0000 g | Freq: Once | INTRAMUSCULAR | Status: AC
  Administered 2011-09-07: 1 g via INTRAVENOUS
  Filled 2011-09-07: qty 10

## 2011-09-07 MED ORDER — AZITHROMYCIN 250 MG PO TABS
250.0000 mg | ORAL_TABLET | Freq: Every day | ORAL | Status: DC
  Administered 2011-09-08: 250 mg via ORAL
  Filled 2011-09-07: qty 1

## 2011-09-07 MED ORDER — ONDANSETRON HCL 4 MG/2ML IJ SOLN
4.0000 mg | Freq: Once | INTRAMUSCULAR | Status: AC
  Administered 2011-09-07: 4 mg via INTRAVENOUS
  Filled 2011-09-07: qty 2

## 2011-09-07 MED ORDER — METHADONE HCL 10 MG/ML PO CONC
110.0000 mg | Freq: Every day | ORAL | Status: DC
  Administered 2011-09-07 – 2011-09-08 (×2): 110 mg via ORAL
  Filled 2011-09-07 (×2): qty 11

## 2011-09-07 MED ORDER — ACETAMINOPHEN 325 MG PO TABS: 650.0000 mg | ORAL_TABLET | Freq: Four times a day (QID) | ORAL | Status: DC | PRN

## 2011-09-07 MED ORDER — HYDROCOD POLST-CHLORPHEN POLST 10-8 MG/5ML PO LQCR: 5.0000 mL | Freq: Two times a day (BID) | ORAL | Status: DC | PRN

## 2011-09-07 MED ORDER — SODIUM CHLORIDE 0.9 % IV SOLN
INTRAVENOUS | Status: DC
  Administered 2011-09-07 – 2011-09-08 (×3): via INTRAVENOUS

## 2011-09-07 MED ORDER — INFLUENZA VIRUS VACC SPLIT PF IM SUSP
0.5000 mL | INTRAMUSCULAR | Status: AC
  Administered 2011-09-08: 0.5 mL via INTRAMUSCULAR
  Filled 2011-09-07: qty 0.5

## 2011-09-07 MED ORDER — ACETAMINOPHEN 650 MG RE SUPP: 650.0000 mg | Freq: Four times a day (QID) | RECTAL | Status: DC | PRN

## 2011-09-07 MED ORDER — ONDANSETRON HCL 4 MG PO TABS: 4.0000 mg | ORAL_TABLET | Freq: Four times a day (QID) | ORAL | Status: DC | PRN

## 2011-09-07 MED ORDER — LORAZEPAM 1 MG PO TABS
1.0000 mg | ORAL_TABLET | Freq: Once | ORAL | Status: AC
  Administered 2011-09-07: 1 mg via ORAL
  Filled 2011-09-07: qty 1

## 2011-09-07 MED ORDER — ALBUTEROL SULFATE (5 MG/ML) 0.5% IN NEBU
2.5000 mg | INHALATION_SOLUTION | Freq: Four times a day (QID) | RESPIRATORY_TRACT | Status: DC
  Administered 2011-09-07 – 2011-09-08 (×5): 2.5 mg via RESPIRATORY_TRACT
  Filled 2011-09-07 (×6): qty 0.5

## 2011-09-07 MED ORDER — INSULIN ASPART 100 UNIT/ML ~~LOC~~ SOLN
SUBCUTANEOUS | Status: AC
  Administered 2011-09-07: 2 [IU] via SUBCUTANEOUS
  Filled 2011-09-07: qty 3

## 2011-09-07 MED ORDER — ALBUTEROL SULFATE (5 MG/ML) 0.5% IN NEBU: 2.5000 mg | INHALATION_SOLUTION | RESPIRATORY_TRACT | Status: DC | PRN

## 2011-09-07 MED ORDER — METHADONE HCL 10 MG/ML PO CONC: 110.0000 mg | Freq: Every day | ORAL | Status: DC

## 2011-09-07 MED ORDER — INSULIN ASPART 100 UNIT/ML ~~LOC~~ SOLN
0.0000 [IU] | Freq: Three times a day (TID) | SUBCUTANEOUS | Status: DC
  Administered 2011-09-07: 2 [IU] via SUBCUTANEOUS

## 2011-09-07 MED ORDER — PREDNISONE 20 MG PO TABS
20.0000 mg | ORAL_TABLET | Freq: Every day | ORAL | Status: DC
  Administered 2011-09-08: 20 mg via ORAL
  Filled 2011-09-07: qty 1

## 2011-09-07 NOTE — H&P (Signed)
Sahith Nurse [id number] 11-19-1990  Referring physician: PCP: Willow Ora, MD, MD   Chief Complaint: Shortness of breath  HPI:  20 year old man with a history of asthma who was brought in via EMS with complaint of nonproductive cough and severe shortness of breath and wheezing. He was given nebulizers in route and he arrived in severe respiratory distress. He was noted to be hypoxic with decreased breath sounds and wheezes per emergency room documentation.   He improved greatly with nebulizer treatment and was initially going to be discharged from the emergency room with a steroid taper. However at the time of discharge he was noted to be weak and dizzy and oxygen saturation dropped into the low 90s with brief ambulation. Further history was obtained of vomiting 6-8 times yesterday. Lab work was then obtained which revealed hyperglycemia, hypokalemia and anemia.  Past Medical History  Diagnosis Date  . Asthma   . Bronchitis   . Tendonitis    History reviewed. No pertinent past surgical history. denies past surgical history. Social History:  reports that he has been smoking.  He does not have any smokeless tobacco history on file. His alcohol and drug histories not on file. Allergies: No Known Allergies History reviewed. No pertinent family history. Prior to Admission medications   Medication Sig Start Date End Date Taking? Authorizing Provider  albuterol (PROVENTIL HFA;VENTOLIN HFA) 108 (90 BASE) MCG/ACT inhaler Inhale 2 puffs into the lungs every 6 (six) hours as needed. Shortness of breath    Yes Historical Provider, MD  albuterol (PROVENTIL) (2.5 MG/3ML) 0.083% nebulizer solution Take 2.5 mg by nebulization every 6 (six) hours as needed. Shortness of breath    Yes Historical Provider, MD  albuterol (PROVENTIL) (5 MG/ML) 0.5% nebulizer solution Take 5 mg by nebulization once.     Yes Historical Provider, MD  dextromethorphan-guaiFENesin (MUCINEX DM) 30-600 MG per 12 hr tablet Take 1  tablet by mouth every 12 (twelve) hours as needed. Cold Symptoms    Yes Historical Provider, MD  ipratropium (ATROVENT) 0.02 % nebulizer solution Take 500 mcg by nebulization once.     Yes Historical Provider, MD  methadone (DOLOPHINE) 10 MG/ML solution Take 110 mg by mouth daily.     Yes Historical Provider, MD  methylPREDNISolone sodium succinate (SOLU-MEDROL) 125 MG injection Inject into the vein once.     Yes Historical Provider, MD  chlorpheniramine-HYDROcodone (TUSSIONEX PENNKINETIC ER) 10-8 MG/5ML LQCR Take 5 mLs by mouth every 12 (twelve) hours as needed. 09/07/11   Roma Kayser Schorr, NP  predniSONE (DELTASONE) 20 MG tablet Take 2 tablets (40 mg total) by mouth daily. 09/07/11 09/17/11  Roma Kayser Schorr, NP   Review of Systems:  Negative for fever, changes to his vision, sore throat, rash, muscle aches, chest pain, dysuria, bleeding. Vomiting at this point has resolved and he is able to drink oral fluids. Shortness of breath somewhat improved.  Physical Exam: General:  Calm and appears comfortable. Able to speak in full sentences. Lying at about 15 upright on stretcher in the emergency department. Nontoxic. Eyes: Pupils equal round and reactive to light. Normal lids and irises. ENT: Grossly normal hearing. Lips and tongue appear unremarkable. Neck: No lymphadenopathy or masses. No thyromegaly. Cardiovascular: Regular rate and rhythm. No murmur rub or gallop. No lower shotty. Respiratory: Decreased breath sounds bilaterally. Fair to poor air movement. Expiratory wheezes noted. However respiratory effort appears normal. Abdomen: Soft, nontender and nondistended. Skin: No rash or lesions seen. Musculoskeletal: Tone grossly normal. It was up in bed  without assistance. Psychiatric: Grossly normal. Makes good eye contact.  Labs on Admission:  Results for orders placed during the hospital encounter of 09/06/11 (from the past 24 hour(s))  CBC     Status: Abnormal   Collection Time    09/07/11  4:01 AM      Component Value Range   WBC 8.6  4.0 - 10.5 (K/uL)   RBC 4.46  4.22 - 5.81 (MIL/uL)   Hemoglobin 12.5 (*) 13.0 - 17.0 (g/dL)   HCT 16.1 (*) 09.6 - 52.0 (%)   MCV 82.3  78.0 - 100.0 (fL)   MCH 28.0  26.0 - 34.0 (pg)   MCHC 34.1  30.0 - 36.0 (g/dL)   RDW 04.5  40.9 - 81.1 (%)   Platelets 210  150 - 400 (K/uL)  DIFFERENTIAL     Status: Abnormal   Collection Time   09/07/11  4:01 AM      Component Value Range   Neutrophils Relative 93 (*) 43 - 77 (%)   Neutro Abs 8.0 (*) 1.7 - 7.7 (K/uL)   Lymphocytes Relative 5 (*) 12 - 46 (%)   Lymphs Abs 0.5 (*) 0.7 - 4.0 (K/uL)   Monocytes Relative 2 (*) 3 - 12 (%)   Monocytes Absolute 0.2  0.1 - 1.0 (K/uL)   Eosinophils Relative 0  0 - 5 (%)   Eosinophils Absolute 0.0  0.0 - 0.7 (K/uL)   Basophils Relative 0  0 - 1 (%)   Basophils Absolute 0.0  0.0 - 0.1 (K/uL)  COMPREHENSIVE METABOLIC PANEL     Status: Abnormal   Collection Time   09/07/11  4:01 AM      Component Value Range   Sodium 133 (*) 135 - 145 (mEq/L)   Potassium 2.4 (*) 3.5 - 5.1 (mEq/L)   Chloride 94 (*) 96 - 112 (mEq/L)   CO2 19  19 - 32 (mEq/L)   Glucose, Bld 335 (*) 70 - 99 (mg/dL)   BUN 10  6 - 23 (mg/dL)   Creatinine, Ser 9.14  0.50 - 1.35 (mg/dL)   Calcium 9.7  8.4 - 78.2 (mg/dL)   Total Protein 7.3  6.0 - 8.3 (g/dL)   Albumin 3.0 (*) 3.5 - 5.2 (g/dL)   AST 19  0 - 37 (U/L)   ALT 11  0 - 53 (U/L)   Alkaline Phosphatase 86  39 - 117 (U/L)   Total Bilirubin 0.2 (*) 0.3 - 1.2 (mg/dL)   GFR calc non Af Amer >90  >90 (mL/min)   GFR calc Af Amer >90  >90 (mL/min)  LIPASE, BLOOD     Status: Abnormal   Collection Time   09/07/11  4:01 AM      Component Value Range   Lipase 10 (*) 11 - 59 (U/L)  BLOOD GAS, ARTERIAL     Status: Abnormal   Collection Time   09/07/11  6:35 AM      Component Value Range   O2 Content 2.0     Delivery systems NASAL CANNULA     pH, Arterial 7.439  7.350 - 7.450    pCO2 arterial 35.7  35.0 - 45.0 (mmHg)   pO2, Arterial  61.3 (*) 80.0 - 100.0 (mmHg)   Bicarbonate 23.8  20.0 - 24.0 (mEq/L)   TCO2 21.5  0 - 100 (mmol/L)   Acid-Base Excess 0.4  0.0 - 2.0 (mmol/L)   O2 Saturation 91.9     Patient temperature 98.6     Collection site LEFT  RADIAL     Drawn by [id number]     Sample type ARTERIAL DRAW     Allens test (pass/fail) PASS  PASS     Radiological Exams on Admission: Dg Chest 2 View  09/06/2011  *RADIOLOGY REPORT*  Clinical Data: 20 year old male with cough and shortness of breath.  CHEST - 2 VIEW  Comparison: None.  Findings: Mildly hyperinflated lungs.  Cardiac size and mediastinal contours are within normal limits.  Visualized tracheal air column is within normal limits.  Diffuse increased interstitial markings. No pneumothorax, pulmonary edema, pleural effusion or confluent pulmonary opacity. No acute osseous abnormality identified.  IMPRESSION: Diffuse increased interstitial markings could relate to viral / atypical respiratory infection or may be chronic (smoking).  Original Report Authenticated By: Harley Hallmark, M.D.   Assessment/Plan Asthma exacerbation: Improved with nebulizer treatments steroid treatment in the emergency room. Continue nebulizers, oxygen as needed and steroids. No evidence of pneumonia. Hyperglycemia: Significance is somewhat unclear given that this was noted after patient had been given Solu-Medrol by EMS. He does have lab work in the system from April of this year and at that time and a normal random glucose. We will check a hemoglobin A1c, place him on sliding scale insulin and monitor. He will need close outpatient followup. He is not in DKA. Hypokalemia: Replete. 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 I have spoken with them this morning and confirmed his dosage of 110 mg daily.  Code Status: Full  Disposition Plan: Home when improved.      Pandora Leiter MD 09/07/2011, 7:31 AM

## 2011-09-07 NOTE — ED Provider Notes (Signed)
History     CSN: [id number] Arrival date & time: 09/06/2011  6:31 PM   First MD Initiated Contact with Patient 09/06/11 1940      Chief Complaint  Patient presents with  . Respiratory Distress    HPI: Patient is a 20 y.o. male presenting with shortness of breath. The history is provided by the patient. No language interpreter was used.  Shortness of Breath  The current episode started today. The problem occurs frequently. The problem has been gradually worsening. The problem is moderate. The symptoms are relieved by nothing. The symptoms are aggravated by activity. Associated symptoms include cough, shortness of breath and wheezing. Pertinent negatives include no chest pain and no fever.  Reports increasing SOB today. Has hx of asthma but has not had his inhaler. States used to have a nebulizer for home use but lost it w/ a recent move. States he usually rarely uses his inhaler and so did not need urgently until today. Has had cough several times w/ associated episode of post tussive vomiting.  Past Medical History  Diagnosis Date  . Asthma   . Bronchitis   . Tendonitis     History reviewed. No pertinent past surgical history.  History reviewed. No pertinent family history.  History  Substance Use Topics  . Smoking status: Current Some Day Smoker -- 1.5 packs/day  . Smokeless tobacco: Not on file  . Alcohol Use:       Review of Systems  Constitutional: Negative.  Negative for fever.  HENT: Negative.   Eyes: Negative.   Respiratory: Positive for cough, shortness of breath and wheezing.   Cardiovascular: Negative.  Negative for chest pain.  Gastrointestinal: Negative.   Genitourinary: Negative.   Musculoskeletal: Negative.   Skin: Negative.   Neurological: Negative.   Hematological: Negative.   Psychiatric/Behavioral: Negative.     Allergies  Review of patient's allergies indicates no known allergies.  Home Medications   Current Outpatient Rx  Name Route Sig  Dispense Refill  . ALBUTEROL SULFATE HFA 108 (90 BASE) MCG/ACT IN AERS Inhalation Inhale 2 puffs into the lungs every 6 (six) hours as needed. Shortness of breath     . ALBUTEROL SULFATE (2.5 MG/3ML) 0.083% IN NEBU Nebulization Take 2.5 mg by nebulization every 6 (six) hours as needed. Shortness of breath     . DEXTROMETHORPHAN-GUAIFENESIN 30-600 MG PO TB12 Oral Take 1 tablet by mouth every 12 (twelve) hours as needed. Cold Symptoms     . METHADONE HCL 10 MG/ML PO CONC Oral Take 110 mg by mouth daily.        BP 120/36  Pulse 96  Temp(Src) 98.1 F (36.7 C) (Oral)  Resp 18  Ht 6\' 4"  (1.93 m)  Wt 175 lb (79.379 kg)  BMI 21.30 kg/m2  SpO2 95%  Physical Exam  Constitutional: He is oriented to person, place, and time. He appears well-developed and well-nourished.  HENT:  Head: Normocephalic and atraumatic.  Eyes: Pupils are equal, round, and reactive to light.  Neck: Neck supple.  Cardiovascular: Normal rate and regular rhythm.   Pulmonary/Chest: Tachypnea noted. He has decreased breath sounds. He has wheezes. He has no rhonchi. He has no rales.       Remains w/ significant decreased BBS after 2 nebs  Abdominal: Soft. Bowel sounds are normal.  Musculoskeletal: Normal range of motion.  Neurological: He is alert and oriented to person, place, and time.  Skin: Skin is warm and dry.  Psychiatric: He has a normal mood and  affect.    ED Course  Procedures  Pt WOB and BBS much improved after albuterol neb 1 hour. Will plan to d/c home w/ Albuterol HFA and place on prednisone x 1 week w/ med for cough. Pt admits to feeling much better and is agreeable w/ plan.   Labs Reviewed - No data to display Dg Chest 2 View  09/06/2011  *RADIOLOGY REPORT*  Clinical Data: 19 year old male with cough and shortness of breath.  CHEST - 2 VIEW  Comparison: None.  Findings: Mildly hyperinflated lungs.  Cardiac size and mediastinal contours are within normal limits.  Visualized tracheal air column is within  normal limits.  Diffuse increased interstitial markings. No pneumothorax, pulmonary edema, pleural effusion or confluent pulmonary opacity. No acute osseous abnormality identified.  IMPRESSION: Diffuse increased interstitial markings could relate to viral / atypical respiratory infection or may be chronic (smoking).  Original Report Authenticated By: Harley Hallmark, M.D.     No diagnosis found.    MDM  CXR w/o acute findings, findings c/w chronic smoking vs viral infection. No pneumonia. BBS much improved after mx nebs and IV Solu-medrol. Now w/ only occassional exp wheezes noted. Pt admits feeling better.  Pt is now tachycardic due to B2 adrenergic effects associated w/ Albuterol nebs. Denies CP.  Discussed plan w/ Dr Weldon Inches who is in aggreement w/ plan.  At time of d/c attempted to ambulate pt and he reported that he was weak and dizzy. 02 sats in low 90's w/ brief ambulation. Pt now reveals that the vomiting yesterday was 6-8 times vs 1 episode that I originally understood. Pt appears pale and lethargic,  but is not wanting to be admitted.  Pt is methadone clinic pt and is concerned he will not receive methadone if admitted.  We'll attempt to hydrate and reassess.  5:58 AM Patient admits to minimal improvement with IV fluids. Slight worsening of expiratory wheezes and decreased air movement bilaterally. Discussed with patient likely need for admission given his intolerance to ambulation persistent tachycardia and shortness of breath. Patient now agreeable with admission plan. We'll consult hospitalist for admission.  Lab results reveal significant hypokalemia hyperglycemia. Patient without history of diabetes. I have discussed findings with grandmother and patient who are agreeable with plan.  Will order arterial blood gas. Spoke with Dr. Onalee Hua was [facility] hospitalist who has agreed to assess patient and admit.   Leanne Chang, NP 09/07/11 [id number]  Leanne Chang, NP 09/07/11 [phone]

## 2011-09-07 NOTE — ED Provider Notes (Signed)
Medical screening examination/treatment/procedure(s) were performed by non-physician practitioner and as supervising physician I was immediately available for consultation/collaboration.   Hanley Seamen, MD 09/07/11 [phone]

## 2011-09-07 NOTE — ED Notes (Signed)
Ambulated pt. Down the hall and back to the room. Pt. Dizzy, fainting, unsteady on his feet and had to be brought back to the bed by EMS and nurse tech.

## 2011-09-08 LAB — BASIC METABOLIC PANEL
CO2: 27 mEq/L (ref 19–32)
Calcium: 8.5 mg/dL (ref 8.4–10.5)
Creatinine, Ser: 0.78 mg/dL (ref 0.50–1.35)
Glucose, Bld: 97 mg/dL (ref 70–99)

## 2011-09-08 LAB — CBC
MCH: 28 pg (ref 26.0–34.0)
MCV: 85.5 fL (ref 78.0–100.0)
Platelets: 168 10*3/uL (ref 150–400)
RDW: 13 % (ref 11.5–15.5)

## 2011-09-08 MED ORDER — ALBUTEROL SULFATE (5 MG/ML) 0.5% IN NEBU: 2.5000 mg | INHALATION_SOLUTION | RESPIRATORY_TRACT | Status: DC | PRN

## 2011-09-08 MED ORDER — DOXYCYCLINE HYCLATE 50 MG PO CAPS: 100.0000 mg | ORAL_CAPSULE | Freq: Two times a day (BID) | ORAL | Status: AC

## 2011-09-08 MED ORDER — ALBUTEROL SULFATE HFA 108 (90 BASE) MCG/ACT IN AERS: 2.0000 | INHALATION_SPRAY | RESPIRATORY_TRACT | Status: DC | PRN

## 2011-09-08 MED ORDER — DOXYCYCLINE HYCLATE 50 MG PO CAPS: 100.0000 mg | ORAL_CAPSULE | Freq: Two times a day (BID) | ORAL | Status: DC

## 2011-09-08 MED ORDER — PREDNISONE 10 MG PO TABS: ORAL_TABLET | ORAL | Status: DC

## 2011-09-08 NOTE — Progress Notes (Signed)
Cm spoke with pt with mother at bedside concerning MD order for home nebulizer. Per pt choice [facility] to deliver nebulizer. DME to be delivered to room prior to discharge. No further HH request from pt or family.

## 2011-09-08 NOTE — Discharge Summary (Signed)
Physician Discharge Summary  [name] WUJ:[id number] DOB: 01/14/1991 DOA: 09/06/2011  PCP: Willow Ora, MD, MD Methadone clinic: [facility]  Admit date: 09/06/2011 Discharge date: 09/08/2011  Discharge Diagnoses:  #1: Asthma exacerbation #2: Hyperglycemia, steroid-induced, resolved #3: Hypokalemia, resolved #4: Methadone dependence  Discharge Condition: Improved  Disposition: Home  Hospital Course:  20 year old man who presented with shortness of breath was found to have an asthma exacerbation and be hypokalemic. He was admitted for further evaluation and treatment.  Asthma exacerbation can quickly improved. No hypoxia. We will complete prednisone taper and antibiotics in the outpatient setting. Continue nebulizer treatments as needed.  Steroid-induced hyperglycemia quickly resolved on oral prednisone.  Please note that the patient was prescribed a benzodiazepine during this hospitalization.  Consults: None  Procedures: None  Discharge Exam: Filed Vitals:   09/08/11 1355  BP: 113/69  Pulse: 70  Temp:   Resp: 16   oxygen saturations normal on room air General: Appears well Cardiovascular: Regular rate and rhythm. No murmur, rub or gallop. Respiratory: Some wheezes present improved air movement. It will speak in full senses.   Discharge Orders    Future Orders Please Complete By Expires   Diet general      Activity as tolerated - No restrictions      Discharge instructions      Comments:   Call physician for shortness of breath.     Current Discharge Medication List    START taking these medications   Details  doxycycline (VIBRAMYCIN) 50 MG capsule Take 2 capsules (100 mg total) by mouth 2 (two) times daily. Qty: 20 capsule, Refills: 0    predniSONE (DELTASONE) 10 MG tablet Take 20 mg by mouth daily starting November 18 and continue through November 21. On November 22 start 10 mg by mouth daily for total of 4 days. Qty: 12 tablet, Refills: 0      CONTINUE these medications which have CHANGED   Details  albuterol (PROVENTIL HFA;VENTOLIN HFA) 108 (90 BASE) MCG/ACT inhaler Inhale 2 puffs into the lungs every 4 (four) hours as needed for wheezing or shortness of breath. Shortness of breath Qty: 1 Inhaler, Refills: 0    albuterol (PROVENTIL) (5 MG/ML) 0.5% nebulizer solution Take 0.5 mLs (2.5 mg total) by nebulization every 4 (four) hours as needed for wheezing or shortness of breath. Qty: 20 mL, Refills: 0      CONTINUE these medications which have NOT CHANGED   Details  dextromethorphan-guaiFENesin (MUCINEX DM) 30-600 MG per 12 hr tablet Take 1 tablet by mouth every 12 (twelve) hours as needed. Cold Symptoms     methadone (DOLOPHINE) 10 MG/ML solution Take 110 mg by mouth daily.        STOP taking these medications     albuterol (PROVENTIL) (2.5 MG/3ML) 0.083% nebulizer solution      ipratropium (ATROVENT) 0.02 % nebulizer solution      methylPREDNISolone sodium succinate (SOLU-MEDROL) 125 MG injection        Follow-up Information    Follow up with Willow Ora, MD in 1 week. (Use inhaler as instructed every 4-6 hours as needed for cough, shortness of breath and or wheezing. If having to use more than 6 times a day return here or see your primary care doctor.  Call Dr Drue Novel in am to arrange follow up as soon as can be arranged. )    Contact information:   [address] [place] [address] [phone]  Time coordinating discharge: 25 minutes.  The results of significant diagnostics from this hospitalization (including imaging, microbiology, ancillary and laboratory) are listed below for reference.    Significant Diagnostic Studies: Dg Chest 2 View  09/06/2011  *RADIOLOGY REPORT*  Clinical Data: 20 year old male with cough and shortness of breath.  CHEST - 2 VIEW  Comparison: None.  Findings: Mildly hyperinflated lungs.  Cardiac size and mediastinal contours are within  normal limits.  Visualized tracheal air column is within normal limits.  Diffuse increased interstitial markings. No pneumothorax, pulmonary edema, pleural effusion or confluent pulmonary opacity. No acute osseous abnormality identified.  IMPRESSION: Diffuse increased interstitial markings could relate to viral / atypical respiratory infection or may be chronic (smoking).  Original Report Authenticated By: Harley Hallmark, M.D.    Microbiology: No results found for this or any previous visit (from the past 240 hour(s)).   Labs: Results for orders placed during the hospital encounter of 09/06/11 (from the past 48 hour(s))  CBC     Status: Abnormal   Collection Time   09/07/11  4:01 AM      Component Value Range Comment   WBC 8.6  4.0 - 10.5 (K/uL)    RBC 4.46  4.22 - 5.81 (MIL/uL)    Hemoglobin 12.5 (*) 13.0 - 17.0 (g/dL)    HCT 95.6 (*) 21.3 - 52.0 (%)    MCV 82.3  78.0 - 100.0 (fL)    MCH 28.0  26.0 - 34.0 (pg)    MCHC 34.1  30.0 - 36.0 (g/dL)    RDW 08.6  57.8 - 46.9 (%)    Platelets 210  150 - 400 (K/uL)   DIFFERENTIAL     Status: Abnormal   Collection Time   09/07/11  4:01 AM      Component Value Range Comment   Neutrophils Relative 93 (*) 43 - 77 (%)    Neutro Abs 8.0 (*) 1.7 - 7.7 (K/uL)    Lymphocytes Relative 5 (*) 12 - 46 (%)    Lymphs Abs 0.5 (*) 0.7 - 4.0 (K/uL)    Monocytes Relative 2 (*) 3 - 12 (%)    Monocytes Absolute 0.2  0.1 - 1.0 (K/uL)    Eosinophils Relative 0  0 - 5 (%)    Eosinophils Absolute 0.0  0.0 - 0.7 (K/uL)    Basophils Relative 0  0 - 1 (%)    Basophils Absolute 0.0  0.0 - 0.1 (K/uL)   COMPREHENSIVE METABOLIC PANEL     Status: Abnormal   Collection Time   09/07/11  4:01 AM      Component Value Range Comment   Sodium 133 (*) 135 - 145 (mEq/L)    Potassium 2.4 (*) 3.5 - 5.1 (mEq/L)    Chloride 94 (*) 96 - 112 (mEq/L)    CO2 19  19 - 32 (mEq/L)    Glucose, Bld 335 (*) 70 - 99 (mg/dL)    BUN 10  6 - 23 (mg/dL)    Creatinine, Ser 6.29  0.50 - 1.35  (mg/dL)    Calcium 9.7  8.4 - 10.5 (mg/dL)    Total Protein 7.3  6.0 - 8.3 (g/dL)    Albumin 3.0 (*) 3.5 - 5.2 (g/dL)    AST 19  0 - 37 (U/L)    ALT 11  0 - 53 (U/L)    Alkaline Phosphatase 86  39 - 117 (U/L)    Total Bilirubin 0.2 (*) 0.3 - 1.2 (mg/dL)    GFR  calc non Af Amer >90  >90 (mL/min)    GFR calc Af Amer >90  >90 (mL/min)   LIPASE, BLOOD     Status: Abnormal   Collection Time   09/07/11  4:01 AM      Component Value Range Comment   Lipase 10 (*) 11 - 59 (U/L)   HEMOGLOBIN A1C     Status: Normal   Collection Time   09/07/11  4:01 AM      Component Value Range Comment   Hemoglobin A1C 5.4  <5.7 (%)    Mean Plasma Glucose 108  <117 (mg/dL)   BLOOD GAS, ARTERIAL     Status: Abnormal   Collection Time   09/07/11  6:35 AM      Component Value Range Comment   O2 Content 2.0      Delivery systems NASAL CANNULA      pH, Arterial 7.439  7.350 - 7.450     pCO2 arterial 35.7  35.0 - 45.0 (mmHg)    pO2, Arterial 61.3 (*) 80.0 - 100.0 (mmHg)    Bicarbonate 23.8  20.0 - 24.0 (mEq/L)    TCO2 21.5  0 - 100 (mmol/L)    Acid-Base Excess 0.4  0.0 - 2.0 (mmol/L)    O2 Saturation 91.9      Patient temperature 98.6      Collection site LEFT RADIAL      Drawn by [id number]      Sample type ARTERIAL DRAW      Allens test (pass/fail) PASS  PASS    GLUCOSE, CAPILLARY     Status: Abnormal   Collection Time   09/07/11  8:58 AM      Component Value Range Comment   Glucose-Capillary 110 (*) 70 - 99 (mg/dL)   GLUCOSE, CAPILLARY     Status: Abnormal   Collection Time   09/07/11 11:23 AM      Component Value Range Comment   Glucose-Capillary 136 (*) 70 - 99 (mg/dL)    Comment 1 Notify RN     GLUCOSE, CAPILLARY     Status: Abnormal   Collection Time   09/07/11  4:59 PM      Component Value Range Comment   Glucose-Capillary 109 (*) 70 - 99 (mg/dL)   GLUCOSE, CAPILLARY     Status: Normal   Collection Time   09/07/11  9:46 PM      Component Value Range Comment   Glucose-Capillary 93  70 -  99 (mg/dL)    Comment 1 Notify RN      Comment 2 Documented in Chart     BASIC METABOLIC PANEL     Status: Abnormal   Collection Time   09/08/11  4:56 AM      Component Value Range Comment   Sodium 140  135 - 145 (mEq/L) DELTA CHECK NOTED   Potassium 3.8  3.5 - 5.1 (mEq/L)    Chloride 108  96 - 112 (mEq/L) DELTA CHECK NOTED   CO2 27  19 - 32 (mEq/L)    Glucose, Bld 97  70 - 99 (mg/dL)    BUN 4 (*) 6 - 23 (mg/dL)    Creatinine, Ser 0.45  0.50 - 1.35 (mg/dL)    Calcium 8.5  8.4 - 10.5 (mg/dL)    GFR calc non Af Amer >90  >90 (mL/min)    GFR calc Af Amer >90  >90 (mL/min)   CBC     Status: Abnormal   Collection Time   09/08/11  4:56 AM      Component Value Range Comment   WBC 11.1 (*) 4.0 - 10.5 (K/uL)    RBC 3.93 (*) 4.22 - 5.81 (MIL/uL)    Hemoglobin 11.0 (*) 13.0 - 17.0 (g/dL)    HCT 16.1 (*) 09.6 - 52.0 (%)    MCV 85.5  78.0 - 100.0 (fL)    MCH 28.0  26.0 - 34.0 (pg)    MCHC 32.7  30.0 - 36.0 (g/dL)    RDW 04.5  40.9 - 81.1 (%)    Platelets 168  150 - 400 (K/uL)   MAGNESIUM     Status: Normal   Collection Time   09/08/11  4:56 AM      Component Value Range Comment   Magnesium 2.1  1.5 - 2.5 (mg/dL)      Signed: Pandora Leiter MD 09/08/2011, 2:25 PM

## 2011-09-10 LAB — GLUCOSE, CAPILLARY: Glucose-Capillary: 100 mg/dL — ABNORMAL HIGH (ref 70–99)

## 2011-09-11 ENCOUNTER — Ambulatory Visit (INDEPENDENT_AMBULATORY_CARE_PROVIDER_SITE_OTHER): Payer: BC Managed Care – PPO | Admitting: Internal Medicine

## 2011-09-11 ENCOUNTER — Encounter: Payer: Self-pay | Admitting: Internal Medicine

## 2011-09-11 VITALS — BP 120/80 | HR 96 | Temp 97.7°F | Ht 76.0 in | Wt 171.0 lb

## 2011-09-11 DIAGNOSIS — R739 Hyperglycemia, unspecified: Secondary | ICD-10-CM

## 2011-09-11 DIAGNOSIS — J45909 Unspecified asthma, uncomplicated: Secondary | ICD-10-CM

## 2011-09-11 DIAGNOSIS — E876 Hypokalemia: Secondary | ICD-10-CM

## 2011-09-11 DIAGNOSIS — R7309 Other abnormal glucose: Secondary | ICD-10-CM

## 2011-09-11 DIAGNOSIS — F1920 Other psychoactive substance dependence, uncomplicated: Secondary | ICD-10-CM

## 2011-09-11 MED ORDER — BECLOMETHASONE DIPROPIONATE 80 MCG/ACT IN AERS: 2.0000 | INHALATION_SPRAY | Freq: Every day | RESPIRATORY_TRACT | Status: DC

## 2011-09-11 MED ORDER — PREDNISONE 10 MG PO TABS: ORAL_TABLET | ORAL | Status: AC

## 2011-09-11 NOTE — Progress Notes (Signed)
  Subjective:    Patient ID: [name], male    DOB: Apr 14, 1991, 20 y.o.   MRN: [id number]  HPI Hospital followup. Labs, x-rays and discharge summary reviewed.  Past Medical History  Diagnosis Date  . Asthma   . Tendonitis   . Methadone dependence    Past Surgical History  Procedure Date  . Wisdom tooth extraction    History   Social History  . Marital Status: Single    Spouse Name: N/A    Number of Children: N/A  . Years of Education: N/A   Occupational History  . Not on file.   Social History Main Topics  . Smoking status: Current Everyday Smoker -- 1.5 packs/day for 5 years  . Smokeless tobacco: Never Used  . Alcohol Use: No  . Drug Use: No     on methadone program started ~ 03-2011  . Sexually Active: Not on file   Other Topics Concern  . Not on file           Review of Systems Good medication compliance with the medicines as prescribed from the hospital. Overall has improved. Denies fever chills Still wheezing and coughing, still producing sputum which is abundant, no longer green, now is yellow. Still feels weak, fatigued.     Objective:   Physical Exam  Constitutional: He is oriented to person, place, and time. He appears well-developed.  HENT:  Head: Normocephalic and atraumatic.  Right Ear: External ear normal.  Left Ear: External ear normal.       Nose congested  Cardiovascular: Normal rate, regular rhythm and normal heart sounds.   No murmur heard. Pulmonary/Chest:       Wheezing  bilaterally, few rhonchi, no increased work or breathing, no distress  Neurological: He is alert and oriented to person, place, and time.      Assessment & Plan:  Today , I spent more than 25 min with the patient, >50% of the time counseling about asthma and drug rehab, and  Also reviewing the chart and labs ordered by other providers

## 2011-09-11 NOTE — Assessment & Plan Note (Signed)
Transient while in the hospital

## 2011-09-11 NOTE — Assessment & Plan Note (Signed)
Corrected before he left the hospital

## 2011-09-11 NOTE — Patient Instructions (Signed)
Prednisone 30 mg daily x 3 days, 20 mg daily x 3 days, 10 mg daily x 3 days, then stop Call for a prescription if needed  Came back in 2 weeks Call anytime if symptoms resurface , ER if severe symptoms

## 2011-09-11 NOTE — Assessment & Plan Note (Addendum)
Started a methadone clinic program 6 months ago, doing well. Patient wonders if that is the right thing to do. I told the patient that in my opinion, he is doing the right thing as long as he is  medically supervised. In fact I praised him for taking that step and looking for help.

## 2011-09-11 NOTE — Assessment & Plan Note (Addendum)
H/o asthma, was essentially  asx until he ended up in the hospital Doing better, discussed diiffrence between rescue vs preventive meds Plan: Finish abx PO prednisone for more days (still wheezing), See instructions Stop tobacco !!! Cont albuterol. Start Qvar

## 2011-09-17 ENCOUNTER — Ambulatory Visit: Payer: BC Managed Care – PPO | Admitting: Internal Medicine

## 2011-09-24 ENCOUNTER — Ambulatory Visit: Payer: BC Managed Care – PPO | Admitting: Internal Medicine

## 2011-09-24 DIAGNOSIS — Z0289 Encounter for other administrative examinations: Secondary | ICD-10-CM

## 2011-10-09 ENCOUNTER — Ambulatory Visit (INDEPENDENT_AMBULATORY_CARE_PROVIDER_SITE_OTHER): Payer: BC Managed Care – PPO | Admitting: Internal Medicine

## 2011-10-09 VITALS — BP 90/60 | HR 64 | Temp 98.0°F | Ht 74.5 in | Wt 168.0 lb

## 2011-10-09 DIAGNOSIS — F329 Major depressive disorder, single episode, unspecified: Secondary | ICD-10-CM

## 2011-10-09 DIAGNOSIS — J45909 Unspecified asthma, uncomplicated: Secondary | ICD-10-CM

## 2011-10-09 DIAGNOSIS — F32A Depression, unspecified: Secondary | ICD-10-CM | POA: Insufficient documentation

## 2011-10-09 DIAGNOSIS — F3289 Other specified depressive episodes: Secondary | ICD-10-CM

## 2011-10-09 MED ORDER — BECLOMETHASONE DIPROPIONATE 80 MCG/ACT IN AERS: 3.0000 | INHALATION_SPRAY | Freq: Every day | RESPIRATORY_TRACT | Status: DC

## 2011-10-09 NOTE — Assessment & Plan Note (Signed)
Patient has symptoms of depression, symptoms apparently started after he quit doing drugs. I had a long conversation with the patient, I strongly recommend him to see the psychiatrist as planned by his mother. We also talked about the importance of staying away from alcohol and drugs. I recommend not to use Cymbalta or any other meds from family members, just stay on what his psychiatrist recommended.

## 2011-10-09 NOTE — Assessment & Plan Note (Signed)
Improved. Concept of maintenance and rescue  inhalers discussed. See instructions

## 2011-10-09 NOTE — Progress Notes (Signed)
  Subjective:    Patient ID: [name], male    DOB: 1991-03-06, 20 y.o.   MRN: [id number]  HPI Followup previous visit, asthma doing much better. Good compliance with Qvar? He is also going to see Dr. Evelene Croon this week, wonders what my opinion is. States that in general he is not feeling happy, he used to be very talkative and friendly and had  a lot of friends but things are different now that he is sober and not using drugs Denies any suicidal or homicidal ideas. 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 At some point, his mother gave him Cymbalta Klonopin, they didn't help much     Objective:   Physical Exam  Constitutional: He is oriented to person, place, and time. He appears well-developed and well-nourished.  HENT:  Head: Normocephalic and atraumatic.  Cardiovascular: Normal rate, regular rhythm and normal heart sounds.   No murmur heard. Pulmonary/Chest:       Other than a few rhonchi,  lungs clear  Neurological: He is alert and oriented to person, place, and time.  Psychiatric: He has a normal mood and affect. His behavior is normal. Judgment and thought content normal.      Assessment & Plan:  Today , I spent more than 25  min with the patient, >50% of the time counseling about depression and drugs

## 2011-10-09 NOTE — Patient Instructions (Signed)
Qvar 3 puffs every day Albuterol only as needed for cough and wheezing Work on quitting tobacco Next visit 4 months, sooner if you use albuterol ("rescue" inhaler) more than 3 times a week

## 2011-10-10 ENCOUNTER — Encounter: Payer: Self-pay | Admitting: Internal Medicine

## 2012-02-08 ENCOUNTER — Ambulatory Visit: Payer: BC Managed Care – PPO | Admitting: Internal Medicine

## 2012-08-12 ENCOUNTER — Ambulatory Visit (INDEPENDENT_AMBULATORY_CARE_PROVIDER_SITE_OTHER): Payer: BC Managed Care – PPO | Admitting: Internal Medicine

## 2012-08-12 ENCOUNTER — Encounter (HOSPITAL_BASED_OUTPATIENT_CLINIC_OR_DEPARTMENT_OTHER): Payer: Self-pay | Admitting: Orthopedic Surgery

## 2012-08-12 ENCOUNTER — Ambulatory Visit (HOSPITAL_BASED_OUTPATIENT_CLINIC_OR_DEPARTMENT_OTHER): Payer: BC Managed Care – PPO | Admitting: Anesthesiology

## 2012-08-12 ENCOUNTER — Encounter (HOSPITAL_BASED_OUTPATIENT_CLINIC_OR_DEPARTMENT_OTHER): Payer: Self-pay | Admitting: Anesthesiology

## 2012-08-12 ENCOUNTER — Encounter (HOSPITAL_BASED_OUTPATIENT_CLINIC_OR_DEPARTMENT_OTHER): Payer: Self-pay | Admitting: *Deleted

## 2012-08-12 ENCOUNTER — Ambulatory Visit (HOSPITAL_BASED_OUTPATIENT_CLINIC_OR_DEPARTMENT_OTHER)
Admission: RE | Admit: 2012-08-12 | Discharge: 2012-08-12 | Disposition: A | Discharge status: Home / Self Care | Payer: BC Managed Care – PPO | Source: Ambulatory Visit | Attending: Orthopedic Surgery | Admitting: Orthopedic Surgery

## 2012-08-12 ENCOUNTER — Encounter: Payer: Self-pay | Admitting: Internal Medicine

## 2012-08-12 ENCOUNTER — Encounter (HOSPITAL_BASED_OUTPATIENT_CLINIC_OR_DEPARTMENT_OTHER)
Admission: RE | Disposition: A | Discharge status: Home / Self Care | Payer: Self-pay | Source: Ambulatory Visit | Attending: Orthopedic Surgery

## 2012-08-12 VITALS — BP 124/78 | HR 85 | Temp 98.1°F | Wt 184.0 lb

## 2012-08-12 DIAGNOSIS — L02519 Cutaneous abscess of unspecified hand: Secondary | ICD-10-CM

## 2012-08-12 DIAGNOSIS — F1920 Other psychoactive substance dependence, uncomplicated: Secondary | ICD-10-CM

## 2012-08-12 DIAGNOSIS — L03119 Cellulitis of unspecified part of limb: Secondary | ICD-10-CM

## 2012-08-12 DIAGNOSIS — F1120 Opioid dependence, uncomplicated: Secondary | ICD-10-CM | POA: Insufficient documentation

## 2012-08-12 DIAGNOSIS — L089 Local infection of the skin and subcutaneous tissue, unspecified: Secondary | ICD-10-CM | POA: Insufficient documentation

## 2012-08-12 DIAGNOSIS — J45909 Unspecified asthma, uncomplicated: Secondary | ICD-10-CM | POA: Insufficient documentation

## 2012-08-12 HISTORY — PX: INCISION AND DRAINAGE OF WOUND: SHX1803

## 2012-08-12 LAB — CBC WITH DIFFERENTIAL/PLATELET
Eosinophils Relative: 1.9 % (ref 0.0–5.0)
HCT: 42.4 % (ref 39.0–52.0)
Hemoglobin: 14 g/dL (ref 13.0–17.0)
Lymphs Abs: 1.8 10*3/uL (ref 0.7–4.0)
Monocytes Relative: 11.4 % (ref 3.0–12.0)
Platelets: 209 10*3/uL (ref 150.0–400.0)
WBC: 5.1 10*3/uL (ref 4.5–10.5)

## 2012-08-12 LAB — URIC ACID: Uric Acid, Serum: 5.7 mg/dL (ref 4.0–7.8)

## 2012-08-12 SURGERY — IRRIGATION AND DEBRIDEMENT WOUND
Anesthesia: Monitor Anesthesia Care | Site: Hand | Laterality: Right | Wound class: Dirty or Infected

## 2012-08-12 MED ORDER — VANCOMYCIN HCL 1000 MG IV SOLR
1000.0000 mg | INTRAVENOUS | Status: DC | PRN
  Administered 2012-08-12: 1000 mg via INTRAVENOUS

## 2012-08-12 MED ORDER — LACTATED RINGERS IV SOLN
INTRAVENOUS | Status: DC
  Administered 2012-08-12: 15:00:00 via INTRAVENOUS

## 2012-08-12 MED ORDER — DOXYCYCLINE HYCLATE 100 MG PO TABS: 100.0000 mg | ORAL_TABLET | Freq: Two times a day (BID) | ORAL | Status: DC

## 2012-08-12 MED ORDER — 0.9 % SODIUM CHLORIDE (POUR BTL) OPTIME
TOPICAL | Status: DC | PRN
  Administered 2012-08-12: 1000 mL

## 2012-08-12 MED ORDER — DEXAMETHASONE SODIUM PHOSPHATE 4 MG/ML IJ SOLN
INTRAMUSCULAR | Status: DC | PRN
  Administered 2012-08-12: 10 mg

## 2012-08-12 MED ORDER — CELECOXIB 200 MG PO CAPS: 400.0000 mg | ORAL_CAPSULE | Freq: Every day | ORAL | Status: DC

## 2012-08-12 MED ORDER — OXYCODONE HCL 5 MG/5ML PO SOLN: 5.0000 mg | Freq: Once | ORAL | Status: DC | PRN

## 2012-08-12 MED ORDER — HYDROMORPHONE HCL PF 1 MG/ML IJ SOLN: 0.2500 mg | INTRAMUSCULAR | Status: DC | PRN

## 2012-08-12 MED ORDER — SULFAMETHOXAZOLE-TRIMETHOPRIM 800-160 MG PO TABS: 1.0000 | ORAL_TABLET | Freq: Two times a day (BID) | ORAL | Status: DC

## 2012-08-12 MED ORDER — PROMETHAZINE HCL 25 MG/ML IJ SOLN: 6.2500 mg | INTRAMUSCULAR | Status: DC | PRN

## 2012-08-12 MED ORDER — BUPIVACAINE-EPINEPHRINE PF 0.5-1:200000 % IJ SOLN
INTRAMUSCULAR | Status: DC | PRN
  Administered 2012-08-12: 150 mg

## 2012-08-12 MED ORDER — OXYCODONE HCL 5 MG PO TABS: 5.0000 mg | ORAL_TABLET | Freq: Once | ORAL | Status: DC | PRN

## 2012-08-12 SURGICAL SUPPLY — 53 items
BAG DECANTER FOR FLEXI CONT (MISCELLANEOUS) IMPLANT
BANDAGE ELASTIC 3 VELCRO ST LF (GAUZE/BANDAGES/DRESSINGS) ×2 IMPLANT
BANDAGE GAUZE ELAST BULKY 4 IN (GAUZE/BANDAGES/DRESSINGS) ×2 IMPLANT
BANDAGE GAUZE STRT 1 STR LF (GAUZE/BANDAGES/DRESSINGS) IMPLANT
BLADE MINI RND TIP GREEN BEAV (BLADE) IMPLANT
BLADE SURG 15 STRL LF DISP TIS (BLADE) ×1 IMPLANT
BLADE SURG 15 STRL SS (BLADE) ×1
BNDG COHESIVE 1X5 TAN STRL LF (GAUZE/BANDAGES/DRESSINGS) IMPLANT
BNDG ELASTIC 2 VLCR STRL LF (GAUZE/BANDAGES/DRESSINGS) IMPLANT
BNDG ESMARK 4X9 LF (GAUZE/BANDAGES/DRESSINGS) IMPLANT
CHLORAPREP W/TINT 26ML (MISCELLANEOUS) ×2 IMPLANT
CLOTH BEACON ORANGE TIMEOUT ST (SAFETY) ×2 IMPLANT
CORDS BIPOLAR (ELECTRODE) ×2 IMPLANT
COVER MAYO STAND STRL (DRAPES) ×2 IMPLANT
COVER TABLE BACK 60X90 (DRAPES) ×2 IMPLANT
CUFF TOURNIQUET SINGLE 18IN (TOURNIQUET CUFF) ×2 IMPLANT
DRAPE EXTREMITY T 121X128X90 (DRAPE) ×2 IMPLANT
DRAPE SURG 17X23 STRL (DRAPES) ×2 IMPLANT
DRSG PAD ABDOMINAL 8X10 ST (GAUZE/BANDAGES/DRESSINGS) ×2 IMPLANT
GAUZE PACKING IODOFORM 1/4X5 (PACKING) ×2 IMPLANT
GAUZE XEROFORM 1X8 LF (GAUZE/BANDAGES/DRESSINGS) ×2 IMPLANT
GLOVE BIO SURGEON STRL SZ7.5 (GLOVE) ×2 IMPLANT
GLOVE BIOGEL PI IND STRL 7.0 (GLOVE) ×1 IMPLANT
GLOVE BIOGEL PI IND STRL 8 (GLOVE) ×1 IMPLANT
GLOVE BIOGEL PI INDICATOR 7.0 (GLOVE) ×1
GLOVE BIOGEL PI INDICATOR 8 (GLOVE) ×1
GLOVE SKINSENSE NS SZ7.0 (GLOVE) ×1
GLOVE SKINSENSE STRL SZ7.0 (GLOVE) ×1 IMPLANT
GOWN PREVENTION PLUS XLARGE (GOWN DISPOSABLE) ×2 IMPLANT
GOWN STRL REIN XL XLG (GOWN DISPOSABLE) ×2 IMPLANT
IV CATH AUTO 14GX1.75 SAFE ORG (IV SOLUTION) ×2 IMPLANT
LOOP VESSEL MAXI BLUE (MISCELLANEOUS) IMPLANT
NEEDLE HYPO 25X1 1.5 SAFETY (NEEDLE) IMPLANT
NS IRRIG 1000ML POUR BTL (IV SOLUTION) ×2 IMPLANT
PACK BASIN DAY SURGERY FS (CUSTOM PROCEDURE TRAY) ×2 IMPLANT
PAD CAST 3X4 CTTN HI CHSV (CAST SUPPLIES) ×1 IMPLANT
PADDING CAST ABS 4INX4YD NS (CAST SUPPLIES) ×1
PADDING CAST ABS COTTON 4X4 ST (CAST SUPPLIES) ×1 IMPLANT
PADDING CAST COTTON 3X4 STRL (CAST SUPPLIES) ×1
SPLINT PLASTER CAST XFAST 3X15 (CAST SUPPLIES) IMPLANT
SPLINT PLASTER XTRA FASTSET 3X (CAST SUPPLIES)
SPONGE GAUZE 4X4 12PLY (GAUZE/BANDAGES/DRESSINGS) ×2 IMPLANT
STOCKINETTE 4X48 STRL (DRAPES) ×2 IMPLANT
SUT ETHILON 4 0 PS 2 18 (SUTURE) ×2 IMPLANT
SWAB CULTURE LIQ STUART DBL (MISCELLANEOUS) ×2 IMPLANT
SYR 20CC LL (SYRINGE) ×2 IMPLANT
SYR BULB 3OZ (MISCELLANEOUS) ×2 IMPLANT
SYR CONTROL 10ML LL (SYRINGE) IMPLANT
TOWEL OR 17X24 6PK STRL BLUE (TOWEL DISPOSABLE) ×2 IMPLANT
TUBE ANAEROBIC SPECIMEN COL (MISCELLANEOUS) ×2 IMPLANT
TUBE FEEDING 5FR 15 INCH (TUBING) IMPLANT
UNDERPAD 30X30 INCONTINENT (UNDERPADS AND DIAPERS) ×2 IMPLANT
WATER STERILE IRR 1000ML POUR (IV SOLUTION) IMPLANT

## 2012-08-12 NOTE — H&P (Signed)
[name] is an 21 y.o. male.   Chief Complaint: right hand infection HPI: 21 yo lhd male with pain, swelling, erythema right hand x 3 days.  Has used IV drugs in that hand.  Most recently on Sunday.  Fever last night.  Seen at primary care and referred for further care.  Some shakes and chills patient states from withdrawal as he tries to quit iv drugs.  Has had previous abscesses that he has been able to pop on his own.  Past Medical History  Diagnosis Date  . Asthma   . Tendonitis   . Methadone dependence     Past Surgical History  Procedure Date  . Wisdom tooth extraction     History reviewed. No pertinent family history. Social History:  reports that he has been smoking.  He has never used smokeless tobacco. He reports that he uses illicit drugs (Heroin). He reports that he does not drink alcohol.  Allergies:  Allergies  Allergen Reactions  . Vicodin (Hydrocodone-Acetaminophen)     AVOID PAIN KILLERS OR ANY OTHER MEDS THAT MAY CAUSE ADDICTION  . Xanax (Alprazolam)     AVOID BENZOS OR ANY OTHER MEDS THAT MAY CAUSE ADDICTION    Medications Prior to Admission  Medication Sig Dispense Refill  . traMADol (ULTRAM) 50 MG tablet Take 50 mg by mouth every 6 (six) hours as needed.      Marland Kitchen albuterol (PROVENTIL HFA;VENTOLIN HFA) 108 (90 BASE) MCG/ACT inhaler Inhale 2 puffs into the lungs every 4 (four) hours as needed for wheezing or shortness of breath. Shortness of breath  1 Inhaler  0  . albuterol (PROVENTIL) (5 MG/ML) 0.5% nebulizer solution Take 0.5 mLs (2.5 mg total) by nebulization every 4 (four) hours as needed for wheezing or shortness of breath.  20 mL  0  . beclomethasone (QVAR) 80 MCG/ACT inhaler Inhale 3 puffs into the lungs daily.  1 Inhaler  12  . doxycycline (VIBRA-TABS) 100 MG tablet Take 1 tablet (100 mg total) by mouth 2 (two) times daily.  20 tablet  0    Results for orders placed in visit on 08/12/12 (from the past 48 hour(s))  CBC WITH DIFFERENTIAL      Status: Normal   Collection Time   08/12/12 11:18 AM      Component Value Range Comment   WBC 5.1  4.5 - 10.5 K/uL    RBC 5.02  4.22 - 5.81 Mil/uL    Hemoglobin 14.0  13.0 - 17.0 g/dL    HCT 40.9  81.1 - 91.4 %    MCV 84.5  78.0 - 100.0 fl    MCHC 32.9  30.0 - 36.0 g/dL    RDW 78.2  95.6 - 21.3 %    Platelets 209.0  150.0 - 400.0 K/uL    Neutrophils Relative 51.0  43.0 - 77.0 %    Lymphocytes Relative 35.4  12.0 - 46.0 %    Monocytes Relative 11.4  3.0 - 12.0 %    Eosinophils Relative 1.9  0.0 - 5.0 %    Basophils Relative 0.3  0.0 - 3.0 %    Neutro Abs 2.6  1.4 - 7.7 K/uL    Lymphs Abs 1.8  0.7 - 4.0 K/uL    Monocytes Absolute 0.6  0.1 - 1.0 K/uL    Eosinophils Absolute 0.1  0.0 - 0.7 K/uL    Basophils Absolute 0.0  0.0 - 0.1 K/uL   URIC ACID     Status: Normal  Collection Time   08/12/12 11:18 AM      Component Value Range Comment   Uric Acid, Serum 5.7  4.0 - 7.8 mg/dL     No results found.   A comprehensive review of systems was negative except for: Constitutional: positive for chills and fevers  Blood pressure 133/86, pulse 81, temperature 98.3 F (36.8 C), temperature source Oral, resp. rate 14, height 6\' 4"  (1.93 m), weight 81.647 kg (180 lb), SpO2 100.00%.  General appearance: alert, cooperative and appears stated age Head: Normocephalic, without obvious abnormality, atraumatic Neck: supple, symmetrical, trachea midline Resp: clear to auscultation bilaterally Cardio: regular rate and rhythm GI: non tender Extremities: intact sensation and capillary refill all digits. +epl/fpl/io.  dorsum of hand swollen and erythematous.  ttp at wrist and pain with motion of wrist.  erythematous patch on volar wrist.  no palmar erythema.  Multiple track marks bilateral upper extremities. Pulses: 2+ and symmetric Skin: as above Neurologic: Grossly normal Incision/Wound: na  Assessment/Plan Right hand abscess.  Recommend I&D in OR.  Risks, benefits, and alternatives of  surgery were discussed and the patient agrees with the plan of care.   Malessa Zartman R 08/12/2012, 4:32 PM

## 2012-08-12 NOTE — Assessment & Plan Note (Addendum)
No longer  under the care of the methadone clinic, unfortunately he is back doing heroin. He asked for help, unable to get into another methadone clinic. Will check with some of the local pain clinics.  Addendum, This is what my staff Luster Landsberg) was able to do:  While he was in my office yesterday, I tried calling a few, and [facility] (ph [phone]) he was not aware of. He called them, and left message for their Triage person to call him back. I also left message to return my call. The gentleman in triage stated that patient did call him, he was getting ready to call him back and assess him by phone & could get him initially seen within 2 weeks.

## 2012-08-12 NOTE — Progress Notes (Signed)
  Subjective:    Patient ID: [name], male    DOB: [birth year], 21 y.o.   MRN: [id number]  HPI Acute visit 3 days ago noted his right hand to be swollen and started to hurt. Symptoms are gradually worse. He had fever last night up to 99.7.  Past Medical History: Asthma L knee tumor (distal femur) Heroin abuse    Social History: went to [employer] x 2 years) Moved back to [employer] ~ 12-2010, lives w/ GP Job-- part time   tobacco 1/2 ppd ETOH- no recently Drugs:  Heroin    Review of Systems Unfortunately, has not been on methadone for the last few months and is using heroin again. Denies sharing needles but he did recently use his right hand for a self injection. Denies chest pain or shortness of breath. No rash. Asthma is currently well controlled    Objective:   Physical Exam  Musculoskeletal:       Arms:  General -- alert, well-developed  HEENT -- conjuntiva w/o lesions  Lungs -- normal respiratory effort, no intercostal retractions, no accessory muscle use, and normal breath sounds.   Right hand swollen at the dorsum, wrist and distal forearm; it is tender, range of motion of the wrist is quite diminished and painful.  The area is red, tender but not particulalrly warm. Fingertips with good capillary refills. He has a few scratches in the skin (from IVD) Heart-- normal rate, regular rhythm, no murmur, and no gallop.   Nails-- no petechia  Psych-- Cognition and judgment appear intact. Alert and cooperative with normal attention span and concentration.  not anxious appearing and not depressed appearing.       Assessment & Plan:

## 2012-08-12 NOTE — Assessment & Plan Note (Addendum)
21 year old gentleman, heroin user who presents with a infection in the right hand. I'm concern about the exquisite tenderness with wrist motion, I wonder about a bone or joint infection. Will asked orthopedic surgery to see him today. Prescribed doxycycline provided Will check stat labs as requested by orthopedic surgery

## 2012-08-12 NOTE — Anesthesia Procedure Notes (Addendum)
Anesthesia Regional Block:  Supraclavicular block  Pre-Anesthetic Checklist: ,, timeout performed, Correct Patient, Correct Site, Correct Laterality, Correct Procedure, Correct Position, site marked, Risks and benefits discussed,  Surgical consent,  Pre-op evaluation,  At surgeon's request and post-op pain management  Laterality: Right  Prep: chloraprep       Needles:  Injection technique: Single-shot  Needle Type: Echogenic Stimulator Needle     Needle Length: 5cm 5 cm Needle Gauge: 22 and 22 G    Additional Needles:  Procedures: ultrasound guided and nerve stimulator Supraclavicular block  Nerve Stimulator or Paresthesia:  Response: bicep contraction, 0.45 mA,   Additional Responses:   Narrative:  Start time: 08/12/2012 3:14 PM End time: 08/12/2012 3:24 PM Injection made incrementally with aspirations every 5 mL.  Performed by: Personally  Anesthesiologist: J. Adonis Huguenin, MD  Additional Notes: Functioning IV was confirmed and monitors applied.  A 50mm 22ga echogenic arrow stimulator was used. Sterile prep and drape,hand hygiene and sterile gloves were used.Ultrasound guidance: relevant anatomy identified, needle position confirmed, local anesthetic spread visualized around nerve(s)., vascular puncture avoided.  Image printed for medical record.  Negative aspiration and negative test dose prior to incremental administration of local anesthetic. The patient tolerated the procedure well.  Interscalene brachial plexus block

## 2012-08-12 NOTE — Op Note (Signed)
Dictation [phone]

## 2012-08-12 NOTE — Anesthesia Postprocedure Evaluation (Signed)
Anesthesia Post Note  Patient: [name]  Procedure(s) Performed: Procedure(s) (LRB): IRRIGATION AND DEBRIDEMENT WOUND (Right)  Anesthesia type: MAC  Patient location: PACU  Post pain: Pain level controlled  Post assessment: Patient's Cardiovascular Status Stable  Last Vitals:  Filed Vitals:   08/12/12 1752  BP:   Pulse: 71  Temp: 36.4 C  Resp:     Post vital signs: Reviewed and stable  Level of consciousness: alert  Complications: No apparent anesthesia complications

## 2012-08-12 NOTE — Progress Notes (Signed)
Assisted Dr. Singer with right, ultrasound guided, supraclavicular block. Side rails up, monitors on throughout procedure. See vital signs in flow sheet. Tolerated Procedure well. 

## 2012-08-12 NOTE — Patient Instructions (Addendum)
Arm elevation, Tylenol as needed. Will refer you to a surgeon today Start the antibiotic twice a day ER if symptoms severe, fever, chills, worsening swelling, finger pain.

## 2012-08-12 NOTE — Transfer of Care (Signed)
Immediate Anesthesia Transfer of Care Note  Patient: [name]  Procedure(s) Performed: Procedure(s) (LRB) with comments: IRRIGATION AND DEBRIDEMENT WOUND (Right)  Patient Location: PACU  Anesthesia Type: MAC combined with regional for post-op pain  Level of Consciousness: awake, alert  and oriented  Airway & Oxygen Therapy: Patient Spontanous Breathing  Post-op Assessment: Report given to PACU RN  Post vital signs: Reviewed and stable  Complications: No apparent anesthesia complications

## 2012-08-12 NOTE — Anesthesia Preprocedure Evaluation (Signed)
Anesthesia Evaluation  Patient identified by MRN, date of birth, ID band Patient awake    Reviewed: Allergy & Precautions, H&P , NPO status , Patient's Chart, lab work & pertinent test results  History of Anesthesia Complications Negative for: history of anesthetic complications  Airway Mallampati: II TM Distance: >3 FB Neck ROM: Full    Dental  (+) Teeth Intact and Dental Advisory Given   Pulmonary asthma , Current Smoker,    Pulmonary exam normal       Cardiovascular negative cardio ROS      Neuro/Psych Depression negative neurological ROS  negative psych ROS   GI/Hepatic negative GI ROS, Neg liver ROS, (+)       IV drug use,   Endo/Other  negative endocrine ROS  Renal/GU negative Renal ROS     Musculoskeletal   Abdominal   Peds  Hematology   Anesthesia Other Findings   Reproductive/Obstetrics                           Anesthesia Physical Anesthesia Plan  ASA: II  Anesthesia Plan: MAC   Post-op Pain Management:    Induction:   Airway Management Planned: Simple Face Mask  Additional Equipment:   Intra-op Plan:   Post-operative Plan:   Informed Consent: I have reviewed the patients History and Physical, chart, labs and discussed the procedure including the risks, benefits and alternatives for the proposed anesthesia with the patient or authorized representative who has indicated his/her understanding and acceptance.   Dental advisory given  Plan Discussed with: CRNA, Anesthesiologist and Surgeon  Anesthesia Plan Comments: (Pt requests no sedation.)        Anesthesia Quick Evaluation

## 2012-08-13 ENCOUNTER — Encounter (HOSPITAL_BASED_OUTPATIENT_CLINIC_OR_DEPARTMENT_OTHER): Payer: Self-pay | Admitting: Orthopedic Surgery

## 2012-08-13 NOTE — Op Note (Signed)
[name], AYSON NO.:  [id number]  MEDICAL RECORD NO.:  [id number]  LOCATION:                                 FACILITY:  PHYSICIAN:  Betha Loa, MD             DATE OF BIRTH:  DATE OF PROCEDURE:  08/12/2012 DATE OF DISCHARGE:                              OPERATIVE REPORT   PREOPERATIVE DIAGNOSIS:  Right hand infection.  POSTOPERATIVE DIAGNOSIS:  Right hand infection.  PROCEDURE:  Irrigation and debridement of right hand and wrist joint.  SURGEON:  Betha Loa, MD  ASSISTANT:  None.  ANESTHESIA:  General regional.  IV FLUIDS:  Per anesthesia flow sheet.  ESTIMATED BLOOD LOSS:  Minimal.  COMPLICATIONS:  None.  SPECIMENS:  Cultures to micro.  TOURNIQUET TIME:  36 minutes.  DISPOSITION:  Stable to PACU.  INDICATIONS:  Mr. Sepp is a 21 year old left-hand dominant male who has been having pain, swelling, and erythema in the right hand for 3 days.  He states he used IV drugs in his right hand at that time.  He has had abscesses before, but none that required surgical drainage.  He thinks this may be an abscess.  He has had some fevers last night, but also chills and shakes, which he thinks are due to withdrawal as he is trying to quit.  He presented to his primary care physician and then was referred to me for further care.  On examination, he had intact sensation and capillary refill in all fingertips.  He could flex and extend the IP joint thumb across fingers.  He has pain with motion of the wrist.  Recommended to Mr. Mainer going to the operating room for irrigation and debridement of the hand and wrist.  Risks, benefits, and alternatives of surgery were discussed including the risk of blood loss, infection, damage to nerves, vessels, tendons, ligaments, bone; failure of surgery; need for additional surgery, complications with wound healing, continued pain, continued infection, need for repeat irrigation and debridement.  He voiced  understanding of these risks and elected to proceed.  OPERATIVE COURSE:  After being identified preoperatively by myself, the patient and I agreed upon procedure and site of procedure.  Surgical site was marked.  The risks, benefits, and alternatives of surgery were reviewed and he wished to proceed.  Surgical consent had been signed. IV antibiotics were held for cultures.  He was given a regional block by Anesthesia in preoperative holding.  He was transported to the operating and placed on the operating room table in supine position with the right upper extremity arm board.  Right upper extremity was prepped and draped in normal sterile orthopedic fashion.  Surgical pause was performed between surgeons, anesthesia, operating staff, and all were in agreement as to the patient, procedure and site of procedure.  Tourniquet at the proximal aspect of the extremity was inflated to 250 mmHg after exsanguination of limb by gravity.  An incision was made at the volar aspect of the distal forearm over an area of erythema.  This was carried into subcutaneous tissues by spreading technique.  The palmaris longus tendon was identified and retracted ulnarly.  Spreading technique was used  to prevent any injury to the palmar cutaneous branch of median nerve.  The flexor tendons of the wrist were identified.  There was no gross purulence.  An incision was then made over the injection area over the thumb metacarpal.  This was carried into subcutaneous tissues by spreading technique again.  The injected vein was identified and had necrosed.  This was excised.  There was no gross purulence.  Incision was made then over the wrist ulnar to Lister's tubercle.  This was carried into subcutaneous tissues by spreading technique.  The 4th dorsal compartment was entered.  There was purulence within the compartment.  This was cultured for aerobes, anaerobes, AFB, and fungus. The wrist joint was entered.  There was  no gross purulence in the radiocarpal or midcarpal joints.  All wounds were copiously irrigated with sterile saline.  The wrist joints were irrigated with an Angiocath needle.  The wounds were all packed with quarter-inch iodoform gauze. Two 4-0 nylon sutures were placed in the dorsal wound to keep the skin edges from retracting, but the skin edges were not approximated.  The wounds were then dressed with sterile 4 x 4s and ABD and wrapped with Kerlix and Ace bandage.  Tourniquet was deflated at 36 minutes.  The fingertips were pink with brisk capillary refill after deflation of tourniquet.  Operative drapes were broken down.  The patient was transferred back to stretcher.  He was taken to PACU in stable condition.  He was given a dose of vancomycin in the operating room.  We will continue him on Bactrim DS 1 p.o. b.i.d. x10 days.  We will have him take Celebrex 400 mg daily x4 days.  He does not want to use any narcotic medications due to their addictive potential and his previous addiction problems.  I will see him back in the office in 3 days.  He agrees with the plan of care.     Betha Loa, MD     KK/MEDQ  D:  08/12/2012  T:  08/13/2012  Job:  [id number]

## 2012-08-15 LAB — CULTURE, ROUTINE-ABSCESS

## 2012-08-17 LAB — ANAEROBIC CULTURE

## 2012-09-24 LAB — AFB CULTURE WITH SMEAR (NOT AT ARMC)

## 2013-12-21 ENCOUNTER — Telehealth: Payer: Self-pay | Admitting: Internal Medicine

## 2013-12-21 NOTE — Telephone Encounter (Signed)
Patient would like a refill for albuterol (PROVENTIL HFA;VENTOLIN HFA) 108 (90 BASE) MCG/ACT inhaler and albuterol (PROVENTIL) (5 MG/ML) 0.5% nebulizer solution   Pharmacy [facility] [id number] [phone]

## 2013-12-22 NOTE — Telephone Encounter (Signed)
Pt requesting albuterol and 0.5% nebulizer  Last OV- 08/12/12  Last refilled- 09/08/11

## 2013-12-22 NOTE — Telephone Encounter (Signed)
Needs office visit, if symptoms severe it needs to be seen ASAP here or go to a urgent care

## 2013-12-22 NOTE — Telephone Encounter (Signed)
Patient grandmother called again regarding phone note below for a refill. Please call patient grandmother if this is possible or not possible.

## 2013-12-23 NOTE — Telephone Encounter (Signed)
Notified pt grandmother .

## 2016-10-30 ENCOUNTER — Ambulatory Visit (HOSPITAL_COMMUNITY)
Admission: EM | Admit: 2016-10-30 | Discharge: 2016-10-30 | Disposition: A | Discharge status: Home / Self Care | Payer: Self-pay | Attending: Family Medicine | Admitting: Family Medicine

## 2016-10-30 ENCOUNTER — Ambulatory Visit (INDEPENDENT_AMBULATORY_CARE_PROVIDER_SITE_OTHER): Payer: Self-pay

## 2016-10-30 ENCOUNTER — Encounter (HOSPITAL_COMMUNITY): Payer: Self-pay | Admitting: Family Medicine

## 2016-10-30 DIAGNOSIS — J4541 Moderate persistent asthma with (acute) exacerbation: Secondary | ICD-10-CM

## 2016-10-30 MED ORDER — IPRATROPIUM-ALBUTEROL 0.5-2.5 (3) MG/3ML IN SOLN
RESPIRATORY_TRACT | Status: AC
  Filled 2016-10-30: qty 3

## 2016-10-30 MED ORDER — PREDNISONE 10 MG (21) PO TBPK: ORAL_TABLET | ORAL | 0 refills | Status: DC

## 2016-10-30 MED ORDER — METHYLPREDNISOLONE ACETATE 80 MG/ML IJ SUSP
INTRAMUSCULAR | Status: AC
Start: 2016-10-30 — End: 2016-10-30
  Filled 2016-10-30: qty 1

## 2016-10-30 MED ORDER — IPRATROPIUM BROMIDE 0.02 % IN SOLN: 0.5000 mg | Freq: Four times a day (QID) | RESPIRATORY_TRACT | 12 refills | Status: DC

## 2016-10-30 MED ORDER — METHYLPREDNISOLONE ACETATE 80 MG/ML IJ SUSP
80.0000 mg | Freq: Once | INTRAMUSCULAR | Status: AC
  Administered 2016-10-30: 80 mg via INTRAMUSCULAR

## 2016-10-30 MED ORDER — IPRATROPIUM-ALBUTEROL 0.5-2.5 (3) MG/3ML IN SOLN
3.0000 mL | Freq: Once | RESPIRATORY_TRACT | Status: AC
  Administered 2016-10-30: 3 mL via RESPIRATORY_TRACT

## 2016-10-30 NOTE — Discharge Instructions (Signed)
You are being treated for asthma exacerbation. You have received a DuoNeb treatment of albuterol and ipratropium along with an injection of methylprednisolone. You are being discharged with a prescription for a steroid taper and a prescription for ipratropium. Mix the ipratropium with your albuterol nebulizer and do a treatment no more than 4 times a day. You may continue to use the albuterol nebulized as needed for shortness of breath as well. Should your symptoms fail to improve or worsen follow up with your primary care provider or return to clinic.

## 2016-10-30 NOTE — ED Triage Notes (Signed)
Pt here for cough, congestion, weak, mucous. sts he was given a z pac 2 weeks ago and nothing helped. sts hx of PNA. sts using nebulizer without relief.

## 2016-10-30 NOTE — ED Provider Notes (Signed)
CSN: [id number]     Arrival date & time 10/30/16  1713 History   First MD Initiated Contact with Patient 10/30/16 1826     Chief Complaint  Patient presents with  . Cough   (Consider location/radiation/quality/duration/timing/severity/associated sxs/prior Treatment) 26 year old male presents to clinic with 2 week history of cough and shortness of breath with wheezing. He reports he was treated at a [facility] 2 weeks ago with Azithromycin without relief. He is asthmatic, and has been using his nebulizer multiple times a day, he is not on a controller medication and states he normally does not need to use his rescue inhaler or his nebulizer.    The history is provided by the patient.  Cough  Associated symptoms: shortness of breath and wheezing   Associated symptoms: no chills and no fever     Past Medical History:  Diagnosis Date  . Asthma   . 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 Tendonitis    Past Surgical History:  Procedure Laterality Date  . INCISION AND DRAINAGE OF WOUND  08/12/2012   Procedure: IRRIGATION AND DEBRIDEMENT WOUND;  Surgeon: Tennis Must, MD;  Location: [facility];  Service: Orthopedics;  Laterality: Right;  . WISDOM TOOTH EXTRACTION     History reviewed. No pertinent family history. Social History  Substance Use Topics  . Smoking status: Current Every Day Smoker    Packs/day: 0.50    Years: 5.00  . Smokeless tobacco: Never Used  . Alcohol use No     Comment: last used on 08/10/2012    Review of Systems  Constitutional: Positive for activity change, appetite change and fatigue. Negative for chills and fever.  HENT: Negative.   Respiratory: Positive for cough, shortness of breath and wheezing.   Cardiovascular: Negative.   Gastrointestinal: Negative.   Musculoskeletal: Negative.   Neurological: Negative.     Allergies  Vicodin [hydrocodone-acetaminophen] and Xanax [alprazolam]  Home Medications   Prior to Admission medications    Medication Sig Start Date End Date Taking? Authorizing Provider  albuterol (PROVENTIL HFA;VENTOLIN HFA) 108 (90 BASE) MCG/ACT inhaler Inhale 2 puffs into the lungs every 4 (four) hours as needed for wheezing or shortness of breath. Shortness of breath 09/08/11   Samuella Cota, MD  albuterol (PROVENTIL) (5 MG/ML) 0.5% nebulizer solution Take 0.5 mLs (2.5 mg total) by nebulization every 4 (four) hours as needed for wheezing or shortness of breath. 09/08/11   Samuella Cota, MD  beclomethasone (QVAR) 80 MCG/ACT inhaler Inhale 3 puffs into the lungs daily. 10/09/11 10/08/12  Colon Branch, MD  celecoxib (CELEBREX) 200 MG capsule Take 2 capsules (400 mg total) by mouth daily. 08/12/12   Leanora Cover, MD  ipratropium (ATROVENT) 0.02 % nebulizer solution Take 2.5 mLs (0.5 mg total) by nebulization 4 (four) times daily. 10/30/16   Barnet Glasgow, NP  predniSONE (STERAPRED UNI-PAK 21 TAB) 10 MG (21) TBPK tablet Take 6 tablets today then decrease by 1 each day till finished 10/30/16   Barnet Glasgow, NP  sulfamethoxazole-trimethoprim (BACTRIM DS) 800-160 MG per tablet Take 1 tablet by mouth 2 (two) times daily. 08/12/12   Leanora Cover, MD  traMADol (ULTRAM) 50 MG tablet Take 50 mg by mouth every 6 (six) hours as needed.    Historical Provider, MD   Meds Ordered and Administered this Visit   Medications  methylPREDNISolone acetate (DEPO-MEDROL) injection 80 mg (80 mg Intramuscular Given 10/30/16 1846)  ipratropium-albuterol (DUONEB) 0.5-2.5 (3) MG/3ML nebulizer solution 3 mL (3  mLs Nebulization Given 10/30/16 1846)    BP 128/63   Pulse 73   Temp 98.8 F (37.1 C)   Resp 18   SpO2 97%  No data found.   Physical Exam  Constitutional: He is oriented to person, place, and time. He appears well-developed and well-nourished. No distress.  HENT:  Head: Normocephalic.  Right Ear: External ear normal.  Left Ear: External ear normal.  Neck: Normal range of motion. No JVD present.  Cardiovascular: Normal  rate and regular rhythm.   Pulmonary/Chest: Effort normal. No respiratory distress. He has no decreased breath sounds. He has wheezes (difuse wheezing in all lung fields). He has no rhonchi. He has no rales.  Abdominal: Soft. Bowel sounds are normal.  Lymphadenopathy:    He has no cervical adenopathy.  Neurological: He is alert and oriented to person, place, and time.  Skin: Skin is warm and dry. Capillary refill takes less than 2 seconds. He is not diaphoretic.  Nursing note and vitals reviewed.   Urgent Care Course   Clinical Course     Procedures (including critical care time)  Labs Review Labs Reviewed - No data to display  Imaging Review Dg Chest 2 View  Result Date: 10/30/2016 CLINICAL DATA:  Cough wheezing and fever EXAM: CHEST  2 VIEW COMPARISON:  09/06/2011 FINDINGS: Hyperinflation is present. There is no focal infiltrate or effusion. Cardiomediastinal silhouette is within normal limits. No pneumothorax. IMPRESSION: Hyperinflation without focal infiltrate Electronically Signed   By: Donavan Foil M.D.   On: 10/30/2016 18:20     Visual Acuity Review  Right Eye Distance:   Left Eye Distance:   Bilateral Distance:    Right Eye Near:   Left Eye Near:    Bilateral Near:         MDM   1. Moderate persistent asthma with exacerbation     You are being treated for asthma exacerbation. You have received a DuoNeb treatment of albuterol and ipratropium along with an injection of methylprednisolone. You are being discharged with a prescription for a steroid taper and a prescription for ipratropium. Mix the ipratropium with your albuterol nebulizer and do a treatment no more than 4 times a day. You may continue to use the albuterol nebulized as needed for shortness of breath as well. Should your symptoms fail to improve or worsen follow up with your primary care provider or return to clinic.     Barnet Glasgow, NP 10/30/16 [phone]

## 2016-11-18 ENCOUNTER — Emergency Department (HOSPITAL_COMMUNITY)
Admission: EM | Admit: 2016-11-18 | Discharge: 2016-11-19 | Disposition: A | Discharge status: Home / Self Care | Payer: Medicaid Other | Attending: Emergency Medicine | Admitting: Emergency Medicine

## 2016-11-18 ENCOUNTER — Emergency Department (HOSPITAL_COMMUNITY): Payer: Medicaid Other

## 2016-11-18 ENCOUNTER — Encounter (HOSPITAL_COMMUNITY): Payer: Self-pay | Admitting: Emergency Medicine

## 2016-11-18 DIAGNOSIS — S82121A Displaced fracture of lateral condyle of right tibia, initial encounter for closed fracture: Secondary | ICD-10-CM | POA: Insufficient documentation

## 2016-11-18 DIAGNOSIS — Y92410 Unspecified street and highway as the place of occurrence of the external cause: Secondary | ICD-10-CM | POA: Insufficient documentation

## 2016-11-18 DIAGNOSIS — S82141A Displaced bicondylar fracture of right tibia, initial encounter for closed fracture: Secondary | ICD-10-CM

## 2016-11-18 DIAGNOSIS — J45909 Unspecified asthma, uncomplicated: Secondary | ICD-10-CM | POA: Insufficient documentation

## 2016-11-18 DIAGNOSIS — Y939 Activity, unspecified: Secondary | ICD-10-CM | POA: Insufficient documentation

## 2016-11-18 DIAGNOSIS — F17200 Nicotine dependence, unspecified, uncomplicated: Secondary | ICD-10-CM | POA: Insufficient documentation

## 2016-11-18 DIAGNOSIS — R51 Headache: Secondary | ICD-10-CM | POA: Insufficient documentation

## 2016-11-18 DIAGNOSIS — Y999 Unspecified external cause status: Secondary | ICD-10-CM | POA: Insufficient documentation

## 2016-11-18 MED ORDER — OXYCODONE-ACETAMINOPHEN 5-325 MG PO TABS
1.0000 | ORAL_TABLET | Freq: Once | ORAL | Status: AC
  Administered 2016-11-18: 1 via ORAL
  Filled 2016-11-18: qty 1

## 2016-11-18 NOTE — ED Notes (Signed)
Pt transported to CT ?

## 2016-11-18 NOTE — ED Notes (Signed)
[employer] officer to speak with pt.

## 2016-11-18 NOTE — ED Triage Notes (Signed)
Pt reports pain to right anterior lower leg and right ankle. Pain on palpation to lateral ankle. Pulses , sensation, and movement present at this time.
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
## 2016-11-18 NOTE — ED Notes (Signed)
Patient transported to X-ray 

## 2016-11-19 MED ORDER — OXYCODONE-ACETAMINOPHEN 5-325 MG PO TABS: 1.0000 | ORAL_TABLET | ORAL | 0 refills | Status: DC | PRN

## 2016-11-19 MED ORDER — IBUPROFEN 600 MG PO TABS: 600.0000 mg | ORAL_TABLET | Freq: Four times a day (QID) | ORAL | 0 refills | Status: DC | PRN

## 2016-11-19 MED ORDER — HYDROMORPHONE HCL 1 MG/ML IJ SOLN
1.0000 mg | Freq: Once | INTRAMUSCULAR | Status: AC
  Administered 2016-11-19: 1 mg via INTRAMUSCULAR
  Filled 2016-11-19: qty 1

## 2016-11-19 MED ORDER — CYCLOBENZAPRINE HCL 10 MG PO TABS: 10.0000 mg | ORAL_TABLET | Freq: Two times a day (BID) | ORAL | 0 refills | Status: DC | PRN

## 2016-11-19 NOTE — Discharge Instructions (Signed)
Ice and elevate the leg at home. Make sure to keep it above heart level to lower the swelling. Take ibuprofen for pain. Take Percocet for severe pain. Flexeril for muscle spasms. Did not apply any weight to the right leg. Use crutches for ambulation and keep knee immobilizer on. Follow-up with Dr. Stann Mainland in the office as referred this week, call tomorrow. Return if any numbness to the right foot, if your foot is cold, pale, or have any new concerning symptoms.
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
## 2016-11-23 ENCOUNTER — Encounter: Payer: Self-pay | Admitting: Physician Assistant

## 2016-11-23 ENCOUNTER — Ambulatory Visit: Payer: No Typology Code available for payment source | Attending: Internal Medicine | Admitting: Physician Assistant

## 2016-11-23 VITALS — BP 128/70 | HR 90 | Temp 97.3°F | Resp 16 | Wt 190.0 lb

## 2016-11-23 DIAGNOSIS — Z885 Allergy status to narcotic agent status: Secondary | ICD-10-CM | POA: Diagnosis not present

## 2016-11-23 DIAGNOSIS — J4520 Mild intermittent asthma, uncomplicated: Secondary | ICD-10-CM

## 2016-11-23 DIAGNOSIS — F1920 Other psychoactive substance dependence, uncomplicated: Secondary | ICD-10-CM | POA: Diagnosis not present

## 2016-11-23 DIAGNOSIS — S82141A Displaced bicondylar fracture of right tibia, initial encounter for closed fracture: Secondary | ICD-10-CM | POA: Diagnosis not present

## 2016-11-23 MED ORDER — ACETAMINOPHEN-CODEINE #3 300-30 MG PO TABS: 1.0000 | ORAL_TABLET | ORAL | 0 refills | Status: DC | PRN

## 2016-11-23 MED ORDER — METHOCARBAMOL 500 MG PO TABS: 500.0000 mg | ORAL_TABLET | Freq: Four times a day (QID) | ORAL | 0 refills | Status: DC

## 2016-11-23 MED ORDER — IBUPROFEN 600 MG PO TABS: 600.0000 mg | ORAL_TABLET | Freq: Three times a day (TID) | ORAL | 0 refills | Status: DC

## 2016-11-23 MED ORDER — OXYCODONE-ACETAMINOPHEN 5-325 MG PO TABS: 1.0000 | ORAL_TABLET | ORAL | 0 refills | Status: DC | PRN

## 2016-11-23 NOTE — Patient Instructions (Signed)
Take meds only as directed.  Grandmother is to dispense medications ONLY as directed.  Keep appointment with Dr Stann Mainland.
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
## 2016-11-30 ENCOUNTER — Ambulatory Visit: Payer: Self-pay | Attending: Internal Medicine

## 2016-12-03 ENCOUNTER — Telehealth: Payer: Self-pay | Admitting: Internal Medicine

## 2016-12-03 DIAGNOSIS — S82121A Displaced fracture of lateral condyle of right tibia, initial encounter for closed fracture: Secondary | ICD-10-CM

## 2016-12-03 NOTE — Telephone Encounter (Signed)
Grandparents would like to speak to referral coordinator on options of orthopedics that accept the orange card.  Please follow up, thank you

## 2016-12-03 NOTE — Telephone Encounter (Signed)
Pts grandparents came in stating pt was referred when in the hospital to [facility] for his Tibial plateau fracture. Pt is covered through the orange card and [employer] does not accept the orange card.  Can Levada Dy place a referral to a different orthopedics since she saw him for his HFU?  Grandparents are concerned because pt is in a lot of pain and are worried the bone is not going to set correctly the longer they wait to see the orthopedic

## 2016-12-03 NOTE — Telephone Encounter (Signed)
Good Afternoon  Patient has the the [place]  So I can refer him to [facility] [employer] [facility] because the orange card don't have Orthopedics .I'm waiting for Dr Jarold Song to place the referral  Thank You

## 2016-12-03 NOTE — Telephone Encounter (Signed)
Dr Guillermina [place] Afternoon  Patient has the the [place]  So I can refer him to [facility] because the orange card don't have Orthopedics . Can you place an Orthopedic Referral Thank You

## 2016-12-03 NOTE — Telephone Encounter (Signed)
Alinda Sierras could you please look into this? Thank you.

## 2016-12-03 NOTE — Telephone Encounter (Signed)
Will forward to covering provider.

## 2016-12-04 ENCOUNTER — Ambulatory Visit (INDEPENDENT_AMBULATORY_CARE_PROVIDER_SITE_OTHER): Payer: Self-pay

## 2016-12-04 ENCOUNTER — Telehealth: Payer: Self-pay | Admitting: Family Medicine

## 2016-12-04 ENCOUNTER — Encounter (INDEPENDENT_AMBULATORY_CARE_PROVIDER_SITE_OTHER): Payer: Self-pay | Admitting: Orthopaedic Surgery

## 2016-12-04 ENCOUNTER — Ambulatory Visit (INDEPENDENT_AMBULATORY_CARE_PROVIDER_SITE_OTHER): Payer: Self-pay | Admitting: Orthopaedic Surgery

## 2016-12-04 DIAGNOSIS — S82121A Displaced fracture of lateral condyle of right tibia, initial encounter for closed fracture: Secondary | ICD-10-CM

## 2016-12-04 MED ORDER — ACETAMINOPHEN-CODEINE #3 300-30 MG PO TABS: 1.0000 | ORAL_TABLET | Freq: Three times a day (TID) | ORAL | 0 refills | Status: DC | PRN

## 2016-12-04 MED ORDER — METHOCARBAMOL 500 MG PO TABS: 500.0000 mg | ORAL_TABLET | Freq: Four times a day (QID) | ORAL | 2 refills | Status: DC | PRN

## 2016-12-04 MED FILL — ACETAMINOPHEN/COD #3 TABLET: 300-30 | 10 days supply | Qty: 30 | Fill #0

## 2016-12-04 NOTE — Telephone Encounter (Signed)
Will forward to covering provider Dr. Jarold Song would you be able to put in the referral for [facility]?

## 2016-12-04 NOTE — Telephone Encounter (Signed)
Good Morning  Pt's Grandmother is here today and she mention that [name] is in a severe pain and if you can place the orthopedic referral so I can refer him to [place]  Thank you  Have a wonderful day .

## 2016-12-04 NOTE — Telephone Encounter (Signed)
Done

## 2016-12-04 NOTE — Telephone Encounter (Signed)
Thank You Dr Jarold Song
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
## 2016-12-13 NOTE — Telephone Encounter (Signed)
Writer called patient to f/u on ortho referral.  Writer spoke with patient's father who did not know if ortho called.  Patient wasn't available.

## 2016-12-19 ENCOUNTER — Ambulatory Visit (INDEPENDENT_AMBULATORY_CARE_PROVIDER_SITE_OTHER): Payer: Self-pay | Admitting: Physician Assistant

## 2016-12-19 ENCOUNTER — Encounter (INDEPENDENT_AMBULATORY_CARE_PROVIDER_SITE_OTHER): Payer: Self-pay | Admitting: Physician Assistant

## 2016-12-19 VITALS — BP 110/72 | HR 77 | Temp 97.9°F | Ht 73.5 in | Wt 184.2 lb

## 2016-12-19 DIAGNOSIS — K0889 Other specified disorders of teeth and supporting structures: Secondary | ICD-10-CM

## 2016-12-19 DIAGNOSIS — K089 Disorder of teeth and supporting structures, unspecified: Secondary | ICD-10-CM

## 2016-12-19 DIAGNOSIS — J45909 Unspecified asthma, uncomplicated: Secondary | ICD-10-CM

## 2016-12-19 DIAGNOSIS — M25561 Pain in right knee: Secondary | ICD-10-CM

## 2016-12-19 MED ORDER — AMOXICILLIN-POT CLAVULANATE 875-125 MG PO TABS: 1.0000 | ORAL_TABLET | Freq: Two times a day (BID) | ORAL | 0 refills | Status: AC

## 2016-12-19 MED ORDER — ALBUTEROL SULFATE HFA 108 (90 BASE) MCG/ACT IN AERS: 2.0000 | INHALATION_SPRAY | RESPIRATORY_TRACT | 5 refills | Status: DC | PRN

## 2016-12-19 MED ORDER — IBUPROFEN 600 MG PO TABS: 600.0000 mg | ORAL_TABLET | Freq: Three times a day (TID) | ORAL | 0 refills | Status: DC | PRN

## 2016-12-19 MED ORDER — PNEUMOCOCCAL VAC POLYVALENT 25 MCG/0.5ML IJ INJ: 0.5000 mL | INJECTION | INTRAMUSCULAR | 0 refills | Status: AC

## 2016-12-19 MED FILL — AMOX-CLAV 875-125 MG TABLET: 875-125 | 10 days supply | Qty: 20 | Fill #0

## 2016-12-19 MED FILL — VENTOLIN HFA 90 MCG INHALER: 108 (90 BAS | 16 days supply | Qty: 18 | Fill #0
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
## 2016-12-19 NOTE — Patient Instructions (Addendum)
Deep Vein Thrombosis A deep vein thrombosis (DVT) is a blood clot (thrombus) that usually occurs in a deep, larger vein of the lower leg or the pelvis, or in an upper extremity such as the arm. These are dangerous and can lead to serious and even life-threatening complications if the clot travels to the lungs. A DVT can damage the valves in your leg veins so that instead of flowing upward, the blood pools in the lower leg. This is called post-thrombotic syndrome, and it can result in pain, swelling, discoloration, and sores on the leg. What are the causes? A DVT is caused by the formation of a blood clot in your leg, pelvis, or arm. Usually, several things contribute to the formation of blood clots. A clot may develop when:  Your blood flow slows down.  Your vein becomes damaged in some way.  You have a condition that makes your blood clot more easily. What increases the risk? A DVT is more likely to develop in:  People who are older, especially over 57 years of age.  People who are overweight (obese).  People who sit or lie still for a long time, such as during long-distance travel (over 4 hours), bed rest, hospitalization, or during recovery from certain medical conditions like a stroke.  People who do not engage in much physical activity (sedentary lifestyle).  People who have chronic breathing disorders.  People who have a personal or family history of blood clots or blood clotting disease.  People who have peripheral vascular disease (PVD), diabetes, or some types of cancer.  People who have heart disease, especially if the person had a recent heart attack or has congestive heart failure.  People who have neurological diseases that affect the legs (leg paresis).  People who have had a traumatic injury, such as breaking a hip or leg.  People who have recently had major or lengthy surgery, especially on the hip, knee, or abdomen.  People who have had a central line placed  inside a large vein.  People who take medicines that contain the hormone estrogen. These include birth control pills and hormone replacement therapy.  Pregnancy or during childbirth or the postpartum period.  Long plane flights (over 8 hours). What are the signs or symptoms?   Symptoms of a DVT can include:  Swelling of your leg or arm, especially if one side is much worse.  Warmth and redness of your leg or arm, especially if one side is much worse.  Pain in your arm or leg. If the clot is in your leg, symptoms may be more noticeable or worse when you stand or walk.  A feeling of pins and needles, if the clot is in the arm. The symptoms of a DVT that has traveled to the lungs (pulmonary embolism, PE) usually start suddenly and include:  Shortness of breath while active or at rest.  Coughing or coughing up blood or blood-tinged mucus.  Chest pain that is often worse with deep breaths.  Rapid or irregular heartbeat.  Feeling light-headed or dizzy.  Fainting.  Feeling anxious.  Sweating. There may also be pain and swelling in a leg if that is where the blood clot started. These symptoms may represent a serious problem that is an emergency. Do not wait to see if the symptoms will go away. Get medical help right away. Call your local emergency services (911 in the [place] Do not drive yourself to the hospital.  How is this diagnosed? Your health care provider  will take a medical history and perform a physical exam. You may also have other tests, including:  Blood tests to assess the clotting properties of your blood.  Imaging tests, such as CT, ultrasound, MRI, X-ray, and other tests to see if you have clots anywhere in your body. How is this treated? After a DVT is identified, it can be treated. The type of treatment that you receive depends on many factors, such as the cause of your DVT, your risk for bleeding or developing more clots, and other medical conditions that you  have. Sometimes, a combination of treatments is necessary. Treatment options may be combined and include:  Monitoring the blood clot with ultrasound.  Taking medicines by mouth, such as newer blood thinners (anticoagulants), thrombolytics, or warfarin.  Taking anticoagulant medicine by injection or through an IV tube.  Wearing compression stockings or using different types ofdevices.  Surgery (rare) to remove the blood clot or to place a filter in your abdomen to stop the blood clot from traveling to your lungs. Treatments for a DVT are often divided into immediate treatment and long-term treatment (up to 3 months after DVT). You can work with your health care provider to choose the treatment program that is best for you. Follow these instructions at home: If you are taking a newer oral anticoagulant:   Take the medicine every single day at the same time each day.  Understand what foods and drugs interact with this medicine.  Understand that there are no regular blood tests required when using this medicine.  Understand the side effects of this medicine, including excessive bruising or bleeding. Ask your health care provider or pharmacist about other possible side effects. If you are taking warfarin:   Understand how to take warfarin and know which foods can affect how warfarin works in Veterinary surgeon.  Understand that it is dangerous to take too much or too little warfarin. Too much warfarin increases the risk of bleeding. Too little warfarin continues to allow the risk for blood clots.  Follow your PT and INR blood testing schedule. The PT and INR results allow your health care provider to adjust your dose of warfarin. It is very important that you have your PT and INR tested as often as told by your health care provider.  Avoid major changes in your diet, or tell your health care provider before you change your diet. Arrange a visit with a registered dietitian to answer your questions.  Many foods, especially foods that are high in vitamin K, can interfere with warfarin and affect the PT and INR results. Eat a consistent amount of foods that are high in vitamin K, such as:  Spinach, kale, broccoli, cabbage, collard greens, turnip greens, Brussels sprouts, peas, cauliflower, seaweed, and parsley.  Beef liver and pork liver.  Green tea.  Soybean oil.  Tell your health care provider about any and all medicines, vitamins, and supplements that you take, including aspirin and other over-the-counter anti-inflammatory medicines. Be especially cautious with aspirin and anti-inflammatory medicines. Do not take those before you ask your health care provider if it is safe to do so. This is important because many medicines can interfere with warfarin and affect the PT and INR results.  Do not start or stop taking any over-the-counter or prescription medicine unless your health care provider or pharmacist tells you to do so. If you take warfarin, you will also need to do these things:  Hold pressure over cuts for longer than usual.  Tell your dentist and other health care providers that you are taking warfarin before you have any procedures in which bleeding may occur.  Avoid alcohol or drink very small amounts. Tell your health care provider if you change your alcohol intake.  Do not use tobacco products, including cigarettes, chewing tobacco, and e-cigarettes. If you need help quitting, ask your health care provider.  Avoid contact sports. General instructions   Take over-the-counter and prescription medicines only as told by your health care provider. Anticoagulant medicines can have side effects, including easy bruising and difficulty stopping bleeding. If you are prescribed an anticoagulant, you will also need to do these things:  Hold pressure over cuts for longer than usual.  Tell your dentist and other health care providers that you are taking anticoagulants before you have  any procedures in which bleeding may occur.  Avoid contact sports.  Wear a medical alert bracelet or carry a medical alert card that says you have had a PE.  Ask your health care provider how soon you can go back to your normal activities. Stay active to prevent new blood clots from forming.  Make sure to exercise while traveling or when you have been sitting or standing for a long period of time. It is very important to exercise. Exercise your legs by walking or by tightening and relaxing your leg muscles often. Take frequent walks.  Wear compression stockings as told by your health care provider to help prevent more blood clots from forming.  Do not use tobacco products, including cigarettes, chewing tobacco, and e-cigarettes. If you need help quitting, ask your health care provider.  Keep all follow-up appointments with your health care provider. This is important. How is this prevented? Take these actions to decrease your risk of developing another DVT:  Exercise regularly. For at least 30 minutes every day, engage in:  Activity that involves moving your arms and legs.  Activity that encourages good blood flow through your body by increasing your heart rate.  Exercise your arms and legs every hour during long-distance travel (over 4 hours). Drink plenty of water and avoid drinking alcohol while traveling.  Avoid sitting or lying in bed for long periods of time without moving your legs.  Maintain a weight that is appropriate for your height. Ask your health care provider what weight is healthy for you.  If you are a woman who is over 27 years of age, avoid unnecessary use of medicines that contain estrogen. These include birth control pills.  Do not smoke, especially if you take estrogen medicines. If you need help quitting, ask your health care provider. If you are hospitalized, prevention measures may include:  Early walking after surgery, as soon as your health care provider  says that it is safe.  Receiving anticoagulants to prevent blood clots.If you cannot take anticoagulants, other options may be available, such as wearing compression stockings or using different types of devices. Get help right away if:  You have new or increased pain, swelling, or redness in an arm or leg.  You have numbness or tingling in an arm or leg.  You have shortness of breath while active or at rest.  You have chest pain.  You have a rapid or irregular heartbeat.  You feel light-headed or dizzy.  You cough up blood.  You notice blood in your vomit, bowel movement, or urine. These symptoms may represent a serious problem that is an emergency. Do not wait to see if the symptoms will  go away. Get medical help right away. Call your local emergency services (911 in the [place] Do not drive yourself to the hospital. This information is not intended to replace advice given to you by your health care provider. Make sure you discuss any questions you have with your health care provider. Document Released: 10/08/2005 Document Revised: 03/15/2016 Document Reviewed: 02/02/2015 Elsevier Interactive Patient Education  2017 [place] Pain Dental pain may be caused by many things, including:  Tooth decay (cavities or caries). Cavities expose the nerve of your tooth to air and hot or cold temperatures. This can cause pain or discomfort.  Abscess or infection. A dental abscess is a collection of infected pus from a bacterial infection in the inner part of the tooth (pulp). It usually occurs at the end of the tooth's root.  Injury.  An unknown reason (idiopathic). Your pain may be mild or severe. It may only occur when:  You are chewing.  You are exposed to hot or cold temperature.  You are eating or drinking sugary foods or beverages, such as soda or candy. Your pain may also be constant. Follow these instructions at home: Watch your dental pain for any changes. The  following actions may help to lessen any discomfort that you are feeling:  Take medicines only as directed by your dentist.  If you were prescribed an antibiotic medicine, finish all of it even if you start to feel better.  Keep all follow-up visits as directed by your dentist. This is important.  Do not apply heat to the outside of your face.  Rinse your mouth or gargle with salt water if directed by your dentist. This helps with pain and swelling.  You can make salt water by adding  tsp of salt to 1 cup of warm water.  Apply ice to the painful area of your face:  Put ice in a plastic bag.  Place a towel between your skin and the bag.  Leave the ice on for 20 minutes, 2-3 times per day.  Avoid foods or drinks that cause you pain, such as:  Very hot or very cold foods or drinks.  Sweet or sugary foods or drinks. Contact a health care provider if:  Your pain is not controlled with medicines.  Your symptoms are worse.  You have new symptoms. Get help right away if:  You are unable to open your mouth.  You are having trouble breathing or swallowing.  You have a fever.  Your face, neck, or jaw is swollen. This information is not intended to replace advice given to you by your health care provider. Make sure you discuss any questions you have with your health care provider. Document Released: 10/08/2005 Document Revised: 02/16/2016 Document Reviewed: 10/04/2014 Elsevier Interactive Patient Education  2017 [employer].

## 2016-12-20 ENCOUNTER — Ambulatory Visit (HOSPITAL_COMMUNITY)
Admission: RE | Admit: 2016-12-20 | Discharge: 2016-12-20 | Disposition: A | Discharge status: Home / Self Care | Payer: Self-pay | Source: Ambulatory Visit | Attending: Physician Assistant | Admitting: Physician Assistant

## 2016-12-20 DIAGNOSIS — M25561 Pain in right knee: Secondary | ICD-10-CM

## 2016-12-20 NOTE — Progress Notes (Signed)
**  Preliminary report by tech**  Right lower extremity venous duplex. There is no evidence of deep or superficial vein thrombosis involving the right lower extremity. All visualized vessels appear patent and compressible. There is no evidence of a Baker's cyst on the right. Results were given to Domenica Fail.  12/20/16 4:01 PM [name] RVT

## 2017-01-01 ENCOUNTER — Encounter (INDEPENDENT_AMBULATORY_CARE_PROVIDER_SITE_OTHER): Payer: Self-pay | Admitting: Orthopaedic Surgery

## 2017-01-01 ENCOUNTER — Ambulatory Visit (INDEPENDENT_AMBULATORY_CARE_PROVIDER_SITE_OTHER): Payer: Self-pay | Admitting: Orthopaedic Surgery

## 2017-01-01 ENCOUNTER — Ambulatory Visit (INDEPENDENT_AMBULATORY_CARE_PROVIDER_SITE_OTHER): Payer: Self-pay

## 2017-01-01 ENCOUNTER — Encounter (INDEPENDENT_AMBULATORY_CARE_PROVIDER_SITE_OTHER): Payer: Self-pay

## 2017-01-01 DIAGNOSIS — S82121D Displaced fracture of lateral condyle of right tibia, subsequent encounter for closed fracture with routine healing: Secondary | ICD-10-CM

## 2017-01-01 NOTE — Progress Notes (Signed)
[name] is 6 weeks status post right tibial plateau fracture. He is been nonweightbearing with a Bledsoe brace. He is doing much better. Pain is significantly better. X-ray show stable alignment. At this point we'll Durene Fruits and weight-bear as tolerated. Physical therapy referral was given. Follow up in 6 weeks with repeat 2 view x-rays of the right knee. Anticipate discharging him at that time.

## 2017-01-09 ENCOUNTER — Encounter: Payer: Self-pay | Admitting: Physical Therapy

## 2017-01-09 ENCOUNTER — Ambulatory Visit: Payer: Self-pay | Attending: Orthopaedic Surgery | Admitting: Physical Therapy

## 2017-01-09 DIAGNOSIS — R262 Difficulty in walking, not elsewhere classified: Secondary | ICD-10-CM | POA: Insufficient documentation

## 2017-01-09 DIAGNOSIS — M6281 Muscle weakness (generalized): Secondary | ICD-10-CM | POA: Insufficient documentation

## 2017-01-09 NOTE — Patient Instructions (Signed)
Hip Flexion / Knee Extension: Straight-Leg Raise (Eccentric)   Lie on back. Lift leg with knee straight. Slowly lower leg for 3-5 seconds. __10_ reps per set, _2__ sets per day. Lower like elevator, stopping at each floor.   ABDUCTION: Side-Lying (Active)   Lie on left side, top leg straight. Raise top leg as far as possible.  Complete __10_ sets of _2__ repetitions. Perform _2__ sessions per day.  http://gtsc.exer.us/94   (Home) Extension: Hip   With support under abdomen, tighten stomach. Lift right leg in line with body. Do not hyperextend. Alternate legs. Repeat __10__ times per set. Do ___2_ sets per session.   Copyright  VHI. All rights reserved.
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
## 2017-01-11 ENCOUNTER — Encounter: Payer: Self-pay | Admitting: Physical Therapy

## 2017-01-11 ENCOUNTER — Ambulatory Visit: Payer: Self-pay | Admitting: Physical Therapy

## 2017-01-11 DIAGNOSIS — M6281 Muscle weakness (generalized): Secondary | ICD-10-CM

## 2017-01-11 DIAGNOSIS — R262 Difficulty in walking, not elsewhere classified: Secondary | ICD-10-CM
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
## 2017-01-15 ENCOUNTER — Ambulatory Visit: Payer: Self-pay | Admitting: Physical Therapy

## 2017-01-15 ENCOUNTER — Encounter: Payer: Self-pay | Admitting: Physical Therapy

## 2017-01-15 DIAGNOSIS — M6281 Muscle weakness (generalized): Secondary | ICD-10-CM

## 2017-01-15 DIAGNOSIS — R262 Difficulty in walking, not elsewhere classified: Secondary | ICD-10-CM
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
## 2017-01-22 ENCOUNTER — Ambulatory Visit: Payer: Self-pay | Attending: Orthopaedic Surgery | Admitting: Physical Therapy

## 2017-01-22 ENCOUNTER — Encounter: Payer: Self-pay | Admitting: Physical Therapy

## 2017-01-22 DIAGNOSIS — M6281 Muscle weakness (generalized): Secondary | ICD-10-CM | POA: Insufficient documentation

## 2017-01-22 DIAGNOSIS — R262 Difficulty in walking, not elsewhere classified: Secondary | ICD-10-CM | POA: Insufficient documentation
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
## 2017-01-24 ENCOUNTER — Ambulatory Visit: Payer: Self-pay | Admitting: Physical Therapy

## 2017-01-24 ENCOUNTER — Telehealth: Payer: Self-pay | Admitting: Physical Therapy

## 2017-01-24 NOTE — Telephone Encounter (Signed)
Called due to no show.  Left message  Zannie Cove, PT 01/24/17 9:55 AM

## 2017-01-25 ENCOUNTER — Ambulatory Visit: Payer: Self-pay | Admitting: Rehabilitative and Restorative Service Providers"

## 2017-01-25 DIAGNOSIS — R262 Difficulty in walking, not elsewhere classified: Secondary | ICD-10-CM

## 2017-01-25 DIAGNOSIS — M6281 Muscle weakness (generalized): Secondary | ICD-10-CM

## 2017-01-25 NOTE — Patient Instructions (Signed)
Advised pt to ice 10 min instead of heat after treatment due to potential for muscle soreness. Pt in agreement.

## 2017-01-25 NOTE — Therapy (Signed)
[facility] [address], [place] [address], [place], [address] Phone: [phone]   Fax:  [phone]  Physical Therapy Treatment  Patient Details  Name: [name] MRN: [id number] Date of Birth: 05-13-1991 Referring Provider: Frankey Shown  Encounter Date: 01/25/2017      PT End of Session - 01/25/17 0934    Visit Number 5   Number of Visits 16   Date for PT Re-Evaluation 03/06/17   PT Start Time 0849   PT Stop Time 0934   PT Time Calculation (min) 45 min   Activity Tolerance Patient tolerated treatment well   Behavior During Therapy [facility] for tasks assessed/performed      Past Medical History:  Diagnosis Date  . Asthma   . 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 Tendonitis     Past Surgical History:  Procedure Laterality Date  . INCISION AND DRAINAGE OF WOUND  08/12/2012   Procedure: IRRIGATION AND DEBRIDEMENT WOUND;  Surgeon: Tennis Must, MD;  Location: [place];  Service: Orthopedics;  Laterality: Right;  . WISDOM TOOTH EXTRACTION      There were no vitals filed for this visit.      Subjective Assessment - 01/25/17 0915    Subjective No pain...only stiffness   Limitations Standing;Walking   How long can you sit comfortably? < 30 min   How long can you stand comfortably? 10-15 min   How long can you walk comfortably? 40 min   Currently in Pain? Yes   Pain Score 1    Pain Location Knee   Pain Orientation Right   Pain Descriptors / Indicators Other (Comment)   Pain Type Acute pain   Pain Onset More than a month ago   Pain Frequency Intermittent   Aggravating Factors  standing, gait, certain positions   Pain Relieving Factors rotating R hip into ER                         [facility] Adult PT Treatment/Exercise - 01/25/17 0001      Knee/Hip Exercises: Stretches   Other Knee/Hip Stretches Precor x 2 min level 2 and discontinued due to R knee pain; SLR without quad set painful;  quad set x 20; quad set with SLR x 15; quad set with VMO SLr x 20; quad set with hip circles CW/CCW x 15 each; bridge x 20; unilat bridge R x 15; standing 3 lb hamstring curl x 20; attempted standing hip ext/diagonal 3 lbs but discontinued due to medial knee pain; standing 3 lb hip ext x 20; standing hip abdct 3 lbs with glute set x 20;  standing 3 lb hip abdct/hip ext/hamstring curl combo x 20 all with glute set; squats x 5 to loosen up knee due to knee flexion preference; standing hamstring stretch x 15 sec standing; all exercises required quad or glute set to decrease medial knee pain                  PT Short Term Goals - 01/22/17 1036      PT SHORT TERM GOAL #1   Title Pt will be independent in initial HEP   Time 4   Period Weeks   Status On-going     PT SHORT TERM GOAL #2   Title Pt will perform gait with normalized gait pattern 100' with LRAD   Time 4   Period Weeks   Status On-going  PT Long Term Goals - 01/15/17 0940      PT LONG TERM GOAL #1   Title Pt will be independent in advanced HEP   Time 8   Period Weeks   Status On-going     PT LONG TERM GOAL #2   Title Pt will improve FOTO to <31% to demo functional improvement   Time 8   Period Weeks   Status On-going     PT LONG TERM GOAL #3   Title Pt will improve Rt knee ROM to [facility] to improve gait and stair negotiation   Time 8   Period Weeks   Status On-going     PT LONG TERM GOAL #4   Title Pt will perform gait without AD for household and community distances   Time 8   Period Weeks   Status On-going     PT LONG TERM GOAL #5   Title Pt will improve Rt hip and knee strength to 4+/5 to improve activity tolerance to return to work   Time 8   Period Weeks   Status On-going               Plan - 01/25/17 0925    Clinical Impression Statement Pt needed max modifications of exercises to decrease R medial knee pain; initially pt tried to exercise through the pain but PT discussed not  exercising through the pain and instead need for modifications. pt with decreased quad activation with all exercises and needed vc's to activate quads or glutes prior to movement for all activities with no knee pain reported after stabilization performed prior.    Rehab Potential Good   PT Frequency 2x / week   PT Duration 8 weeks   PT Treatment/Interventions ADLs/Self Care Home Management;Iontophoresis 4mg /ml Dexamethasone;Moist Heat;Cryotherapy;Ultrasound;Therapeutic activities;Electrical Stimulation;Gait training;DME Instruction;Stair training;Functional mobility training;Patient/family education;Manual techniques;Neuromuscular re-education;Balance training;Therapeutic exercise;Orthotic Fit/Training;Passive range of motion;Dry needling;Taping;Vasopneumatic Device   PT Next Visit Plan gait and LE stability and strength   PT Home Exercise Plan SLR 4 ways, standing wt shifts, gait with crutches with WBAT   Consulted and Agree with Plan of Care Patient      Patient will benefit from skilled therapeutic intervention in order to improve the following deficits and impairments:  Abnormal gait, Decreased balance, Decreased endurance, Decreased activity tolerance, Hypermobility, Pain, Decreased range of motion, Difficulty walking  Visit Diagnosis: Difficulty in walking, not elsewhere classified  Muscle weakness (generalized)     Problem List Patient Active Problem List   Diagnosis Date Noted  . Closed fracture of lateral portion of right tibial plateau 12/04/2016  . Cellulitis of hand 08/12/2012  . Drug addiction ([place]) 09/11/2011  . 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 Asthma 03/28/2010    ARTIS,Autumne Kallio, PT 01/25/2017, 9:35 AM  [facility] [address], [place], [place], [address] Phone: [phone]   Fax:  [phone]  Name: [name] MRN: [id number] Date of Birth: 09/14/1991

## 2017-01-29 ENCOUNTER — Encounter: Payer: Self-pay | Admitting: Physical Therapy

## 2017-01-29 ENCOUNTER — Ambulatory Visit: Payer: Self-pay | Admitting: Physical Therapy

## 2017-01-29 DIAGNOSIS — R262 Difficulty in walking, not elsewhere classified: Secondary | ICD-10-CM

## 2017-01-29 DIAGNOSIS — M6281 Muscle weakness (generalized): Secondary | ICD-10-CM

## 2017-01-29 NOTE — Therapy (Signed)
[facility] [address], [place], [place], [address] Phone: [phone]   Fax:  [phone]  Physical Therapy Treatment  Patient Details  Name: [name] MRN: [id number] Date of Birth: [birth year] Referring Provider: Frankey Shown  Encounter Date: 01/29/2017      PT End of Session - 01/29/17 0933    Visit Number 6   Number of Visits 16   Date for PT Re-Evaluation 03/06/17   PT Start Time 0930   PT Stop Time 5638   PT Time Calculation (min) 45 min   Activity Tolerance Patient tolerated treatment well   Behavior During Therapy [facility] for tasks assessed/performed      Past Medical History:  Diagnosis Date  . Asthma   . 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 Tendonitis     Past Surgical History:  Procedure Laterality Date  . INCISION AND DRAINAGE OF WOUND  08/12/2012   Procedure: IRRIGATION AND DEBRIDEMENT WOUND;  Surgeon: Tennis Must, MD;  Location: [place];  Service: Orthopedics;  Laterality: Right;  . WISDOM TOOTH EXTRACTION      There were no vitals filed for this visit.      Subjective Assessment - 01/29/17 0932    Subjective No pain, stiffness is not too bad. Most pain is at night. Still wakes me up with shooting pain   Limitations Standing;Walking   How long can you sit comfortably? < 30 min   How long can you stand comfortably? 10-15 min   How long can you walk comfortably? 40 min   Currently in Pain? Yes   Pain Score 2    Pain Location Knee   Pain Orientation Right   Pain Type Acute pain   Pain Onset More than a month ago   Pain Frequency Intermittent                         [facility] Adult PT Treatment/Exercise - 01/29/17 0001      Therapeutic Activites    Therapeutic Activities Other Therapeutic Activities   Other Therapeutic Activities Standing, walking with improved gait mechanics, and weight shifting to improve weight bearing activities     Neuro Re-ed     Neuro Re-ed Details  balance and weight shifting to improve proprioception through Rt LE     Knee/Hip Exercises: Aerobic   Stationary Bike L2 x 5 minutes  for endurance   Elliptical L1 x 5 minutes  therapist present to discuss treatment     Knee/Hip Exercises: Standing   Heel Raises Both;2 sets;10 reps   Knee Flexion Strengthening;Both;2 sets;10 reps  hmastring curls   Hip Flexion Stengthening;Right;Left;20 reps   Hip Abduction Stengthening;Both;2 sets;10 reps   Hip Extension Stengthening;Both;2 sets;10 reps     Knee/Hip Exercises: Supine   Short Arc Quad Sets Strengthening;Left;10 reps;3 sets   Bridges with Clamshell AROM;Both;2 sets;10 reps   Straight Leg Raises Strengthening;Both;2 sets;10 reps                  PT Short Term Goals - 01/29/17 0933      PT SHORT TERM GOAL #1   Title Pt will be independent in initial HEP   Status Achieved           PT Long Term Goals - 01/29/17 0934      PT LONG TERM GOAL #2   Title Pt will improve FOTO to <31% to demo functional improvement   Time 8  Period Weeks   Status On-going     PT LONG TERM GOAL #3   Title Pt will improve Rt knee ROM to [facility] to improve gait and stair negotiation   Time 8   Period Weeks   Status On-going               Plan - 01/29/17 1007    Clinical Impression Statement Pt contines to have decreased endurance and strength especially in standing exercises. Pt able to acheive good contractions in quad and glutes during exercises but fatigues quickly. 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 Pt will continue to benefit from skilled therapy for strength, endurance, and progression of weight bearing tolerance.    Rehab Potential Good   PT Frequency 2x / week   PT Duration 8 weeks   PT Treatment/Interventions ADLs/Self Care Home Management;Iontophoresis 4mg /ml Dexamethasone;Moist  Heat;Cryotherapy;Ultrasound;Therapeutic activities;Electrical Stimulation;Gait training;DME Instruction;Stair training;Functional mobility training;Patient/family education;Manual techniques;Neuromuscular re-education;Balance training;Therapeutic exercise;Orthotic Fit/Training;Passive range of motion;Dry needling;Taping;Vasopneumatic Device   PT Next Visit Plan Progress weight bearing tolerance, gait training   Consulted and Agree with Plan of Care Patient      Patient will benefit from skilled therapeutic intervention in order to improve the following deficits and impairments:  Abnormal gait, Decreased balance, Decreased endurance, Decreased activity tolerance, Hypermobility, Pain, Decreased range of motion, Difficulty walking  Visit Diagnosis: Difficulty in walking, not elsewhere classified  Muscle weakness (generalized)     Problem List Patient Active Problem List   Diagnosis Date Noted  . Closed fracture of lateral portion of right tibial plateau 12/04/2016  . Cellulitis of hand 08/12/2012  . Drug addiction ([place]) 09/11/2011  . 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 Asthma 03/28/2010    Mikle Bosworth PTA 01/29/2017, 12:51 PM  [facility] [address] [place], [place], [address] Phone: [phone]   Fax:  [phone]  Name: [name] MRN: [id number] Date of Birth: 12/10/1990

## 2017-01-31 ENCOUNTER — Encounter: Payer: Self-pay | Admitting: Physical Therapy

## 2017-01-31 ENCOUNTER — Ambulatory Visit: Payer: Self-pay | Admitting: Physical Therapy

## 2017-01-31 DIAGNOSIS — M6281 Muscle weakness (generalized): Secondary | ICD-10-CM

## 2017-01-31 DIAGNOSIS — R262 Difficulty in walking, not elsewhere classified: Secondary | ICD-10-CM

## 2017-01-31 NOTE — Therapy (Signed)
[facility] [address], [place], [place], [address] Phone: [phone]   Fax:  [phone]  Physical Therapy Treatment  Patient Details  Name: [name] MRN: [id number] Date of Birth: 1991/02/27 Referring Provider: Frankey Shown  Encounter Date: 01/31/2017      PT End of Session - 01/31/17 0932    Visit Number 7   Number of Visits 16   Date for PT Re-Evaluation 03/06/17   PT Start Time 0930   PT Stop Time 1012   PT Time Calculation (min) 42 min   Activity Tolerance Patient tolerated treatment well   Behavior During Therapy [facility] for tasks assessed/performed      Past Medical History:  Diagnosis Date  . Asthma   . 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 Tendonitis     Past Surgical History:  Procedure Laterality Date  . INCISION AND DRAINAGE OF WOUND  08/12/2012   Procedure: IRRIGATION AND DEBRIDEMENT WOUND;  Surgeon: Tennis Must, MD;  Location: [place];  Service: Orthopedics;  Laterality: Right;  . WISDOM TOOTH EXTRACTION      There were no vitals filed for this visit.      Subjective Assessment - 01/31/17 0931    Subjective Increase pain today after walking in park for about a mile. Pt stated he was just trying to see how far he could go. Constant pain behind knee, pain in front of knee with standing.    Limitations Standing;Walking   How long can you sit comfortably? < 30 min   How long can you stand comfortably? 10-15 min   How long can you walk comfortably? 40 min   Currently in Pain? Yes   Pain Score 8    Pain Location Knee   Pain Orientation Right   Pain Descriptors / Indicators Sore   Pain Type Acute pain   Pain Onset More than a month ago   Pain Frequency Intermittent   Aggravating Factors  Standing, gait, certain positions   Pain Relieving Factors rotating R hip into ER                         [facility] Adult PT Treatment/Exercise - 01/31/17 0001      Knee/Hip Exercises: Aerobic   Nustep L1 x 10 minutes LE only  Seat 10; Therapist present to discuss treatment     Knee/Hip Exercises: Supine   Heel Slides Strengthening;Right;2 sets;10 reps   Straight Leg Raises Strengthening;Both;2 sets;10 reps     Manual Therapy   Manual Therapy Soft tissue mobilization;Joint mobilization   Joint Mobilization Patellar glides medial lateral and superior inferior   Soft tissue mobilization To hamstrings, poplitius, and quad distally                  PT Short Term Goals - 01/29/17 0933      PT SHORT TERM GOAL #1   Title Pt will be independent in initial HEP   Status Achieved           PT Long Term Goals - 01/29/17 0934      PT LONG TERM GOAL #2   Title Pt will improve FOTO to <31% to demo functional improvement   Time 8   Period Weeks   Status On-going     PT LONG TERM GOAL #3   Title Pt will improve Rt knee ROM to [facility] to improve gait and stair negotiation   Time 8  Period Weeks   Status On-going               Plan - 01/31/17 1016    Clinical Impression Statement Pt presents with single axilary crutch and antalgic gait due to increased pain today from walking at home. Pt very tender and tight in posterior knee. Pt is hoping to return to work at end of month and will continue to benefit from skilled therapy for improved strength, stability, and tolerance for standing and walking.    Rehab Potential Good   PT Frequency 2x / week   PT Duration 8 weeks   PT Treatment/Interventions ADLs/Self Care Home Management;Iontophoresis 4mg /ml Dexamethasone;Moist Heat;Cryotherapy;Ultrasound;Therapeutic activities;Electrical Stimulation;Gait training;DME Instruction;Stair training;Functional mobility training;Patient/family education;Manual techniques;Neuromuscular re-education;Balance training;Therapeutic exercise;Orthotic Fit/Training;Passive range of motion;Dry needling;Taping;Vasopneumatic Device   PT Next Visit Plan Progress  weight bearing tolerance, gait training   Consulted and Agree with Plan of Care Patient      Patient will benefit from skilled therapeutic intervention in order to improve the following deficits and impairments:  Abnormal gait, Decreased balance, Decreased endurance, Decreased activity tolerance, Hypermobility, Pain, Decreased range of motion, Difficulty walking  Visit Diagnosis: Difficulty in walking, not elsewhere classified  Muscle weakness (generalized)     Problem List Patient Active Problem List   Diagnosis Date Noted  . Closed fracture of lateral portion of right tibial plateau 12/04/2016  . Cellulitis of hand 08/12/2012  . Drug addiction ([place]) 09/11/2011  . 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 Asthma 03/28/2010    [name] 01/31/2017, 10:20 AM  [facility] [address] [place], [place], [address] Phone: [phone]   Fax:  [phone]  Name: [name] MRN: [id number] Date of Birth: [birth year]

## 2017-02-05 ENCOUNTER — Ambulatory Visit (INDEPENDENT_AMBULATORY_CARE_PROVIDER_SITE_OTHER): Payer: Self-pay | Admitting: Orthopaedic Surgery

## 2017-02-05 ENCOUNTER — Ambulatory Visit (INDEPENDENT_AMBULATORY_CARE_PROVIDER_SITE_OTHER): Payer: Self-pay

## 2017-02-05 ENCOUNTER — Ambulatory Visit: Payer: Self-pay | Admitting: Physical Therapy

## 2017-02-05 ENCOUNTER — Encounter: Payer: Self-pay | Admitting: Physical Therapy

## 2017-02-05 ENCOUNTER — Encounter: Payer: Medicaid Other | Admitting: Physical Therapy

## 2017-02-05 DIAGNOSIS — S82121D Displaced fracture of lateral condyle of right tibia, subsequent encounter for closed fracture with routine healing: Secondary | ICD-10-CM

## 2017-02-05 DIAGNOSIS — R262 Difficulty in walking, not elsewhere classified: Secondary | ICD-10-CM

## 2017-02-05 DIAGNOSIS — M6281 Muscle weakness (generalized): Secondary | ICD-10-CM

## 2017-02-05 NOTE — Therapy (Signed)
[facility] [address] [place], [place], [address] Phone: [phone]   Fax:  [phone]  Physical Therapy Treatment  Patient Details  Name: [name] MRN: [id number] Date of Birth: 12-04-1990 Referring Provider: Frankey Shown  Encounter Date: 02/05/2017      PT End of Session - 02/05/17 1538    Visit Number 8   Number of Visits 16   Date for PT Re-Evaluation 03/06/17   PT Start Time 6962   PT Stop Time 1611   PT Time Calculation (min) 38 min   Activity Tolerance Patient tolerated treatment well   Behavior During Therapy [facility] for tasks assessed/performed      Past Medical History:  Diagnosis Date  . Asthma   . 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 Tendonitis     Past Surgical History:  Procedure Laterality Date  . INCISION AND DRAINAGE OF WOUND  08/12/2012   Procedure: IRRIGATION AND DEBRIDEMENT WOUND;  Surgeon: Tennis Must, MD;  Location: [place];  Service: Orthopedics;  Laterality: Right;  . WISDOM TOOTH EXTRACTION      There were no vitals filed for this visit.      Subjective Assessment - 02/05/17 1537    Subjective LE feeling better today. I walked, played golf, and plated some flowers. Had some pain but went away after a few hours.    Limitations Standing;Walking   How long can you sit comfortably? < 30 min   How long can you stand comfortably? 10-15 min   How long can you walk comfortably? 40 min   Currently in Pain? No/denies   Pain Score 0-No pain                         [facility] Adult PT Treatment/Exercise - 02/05/17 0001      Knee/Hip Exercises: Aerobic   Nustep L1 x 7 minutes LE only  Seat 10; Therapist present to discuss treatment     Knee/Hip Exercises: Machines for Strengthening   Total Gym Leg Press Seat 7 3x10 #90 Bil     Knee/Hip Exercises: Standing   Heel Raises --   Knee Flexion --   Hip Flexion --   Hip Abduction --   Hip Extension  --   SLS 2x 30 seconds on floor   Walking with Sports Cord 4 directions x8   #25 front; #20 back, side to side     Knee/Hip Exercises: Supine   Quad Sets Strengthening;Right;1 set;10 reps  #2                  PT Short Term Goals - 02/05/17 1538      PT SHORT TERM GOAL #2   Title Pt will perform gait with normalized gait pattern 100' with LRAD   Time 4   Period Weeks   Status On-going           PT Long Term Goals - 02/05/17 1538      PT LONG TERM GOAL #1   Title Pt will be independent in advanced HEP   Time 8   Period Weeks   Status On-going               Plan - 02/05/17 1606    Clinical Impression Statement Pt presents with no crutches and reports LE feeling better. Did well with standing exercises but very fatigued afterwards. Pt will continue to benefit from skilled thearpy for LE strenght  and muscle endurance.    Rehab Potential Good   PT Frequency 2x / week   PT Duration 8 weeks   PT Treatment/Interventions ADLs/Self Care Home Management;Iontophoresis 4mg /ml Dexamethasone;Moist Heat;Cryotherapy;Ultrasound;Therapeutic activities;Electrical Stimulation;Gait training;DME Instruction;Stair training;Functional mobility training;Patient/family education;Manual techniques;Neuromuscular re-education;Balance training;Therapeutic exercise;Orthotic Fit/Training;Passive range of motion;Dry needling;Taping;Vasopneumatic Device   PT Next Visit Plan Progress weight bearing tolerance, gait training   Consulted and Agree with Plan of Care Patient      Patient will benefit from skilled therapeutic intervention in order to improve the following deficits and impairments:  Abnormal gait, Decreased balance, Decreased endurance, Decreased activity tolerance, Hypermobility, Pain, Decreased range of motion, Difficulty walking  Visit Diagnosis: Difficulty in walking, not elsewhere classified  Muscle weakness (generalized)     Problem List Patient Active Problem List    Diagnosis Date Noted  . Closed fracture of lateral portion of right tibial plateau 12/04/2016  . Cellulitis of hand 08/12/2012  . Drug addiction ([place]) 09/11/2011  . 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 Asthma 03/28/2010    Mikle Bosworth PTA 02/05/2017, 4:15 PM  [facility] [address] [place], [place], [address] Phone: [phone]   Fax:  [phone]  Name: [name] MRN: [id number] Date of Birth: 10/19/1991

## 2017-02-05 NOTE — Progress Notes (Signed)
[name] is 3 months status post a slightly depressed lateral tibial plateau fracture. He is been advancing with physical therapy. He is been weightbearing fine. The pain is worse with increased weightbearing. Overall he is doing well. He is increasing his activity as tolerated. On exam he has no swelling and excellent range of motion. He does have some tenderness along the lateral aspect of the knee. Otherwise he is ambulating in normal pace without a limp.  Patient is released to activity as tolerated. Follow-up with me as needed.

## 2017-02-07 ENCOUNTER — Encounter: Payer: Medicaid Other | Admitting: Physical Therapy

## 2017-02-12 ENCOUNTER — Encounter: Payer: Self-pay | Admitting: Physical Therapy

## 2017-02-12 ENCOUNTER — Ambulatory Visit: Payer: Self-pay | Admitting: Physical Therapy

## 2017-02-12 DIAGNOSIS — R262 Difficulty in walking, not elsewhere classified: Secondary | ICD-10-CM

## 2017-02-12 DIAGNOSIS — M6281 Muscle weakness (generalized): Secondary | ICD-10-CM

## 2017-02-12 NOTE — Therapy (Signed)
[facility] [address], [place], [place], [address] Phone: [phone]   Fax:  [phone]  Physical Therapy Treatment  Patient Details  Name: [name] MRN: [id number] Date of Birth: December 30, 1990 Referring Provider: Frankey Shown  Encounter Date: 02/12/2017      PT End of Session - 02/12/17 0936    Visit Number 9   Number of Visits 16   Date for PT Re-Evaluation 03/06/17   PT Start Time 0935   PT Stop Time 1013   PT Time Calculation (min) 38 min   Activity Tolerance Patient tolerated treatment well   Behavior During Therapy [facility] for tasks assessed/performed      Past Medical History:  Diagnosis Date  . Asthma   . 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 Tendonitis     Past Surgical History:  Procedure Laterality Date  . INCISION AND DRAINAGE OF WOUND  08/12/2012   Procedure: IRRIGATION AND DEBRIDEMENT WOUND;  Surgeon: Tennis Must, MD;  Location: [place];  Service: Orthopedics;  Laterality: Right;  . WISDOM TOOTH EXTRACTION      There were no vitals filed for this visit.      Subjective Assessment - 02/12/17 0940    Subjective I played disc golf on Wednesday last week and jerked my knee.  States he hasn't been using any AD for walking and can walk stairs reciprocally.  He plans to go back to work next Monday.   Limitations Standing;Walking   How long can you walk comfortably? 45 min   Currently in Pain? No/denies                         [facility] Adult PT Treatment/Exercise - 02/12/17 0001      Therapeutic Activites    Therapeutic Activities Other Therapeutic Activities   Other Therapeutic Activities Standing, walking with improved gait mechanics, and weight shifting to improve weight bearing activities     Neuro Re-ed    Neuro Re-ed Details  balance and weight shifting to improve proprioception through Rt LE     Knee/Hip Exercises: Aerobic   Nustep L3 x 7 minutes LE  only  Seat 12; Therapist present to discuss treatment     Knee/Hip Exercises: Machines for Strengthening   Total Gym Leg Press Seat 7 3x10 #100 Bil     Knee/Hip Exercises: Standing   Lateral Step Up Step Height: 6";Hand Hold: 1;20 reps;Right   Forward Step Up Right;Left;2 sets;10 reps;Hand Hold: 1  BOSU   Walking with Sports Cord 4 directions x8   #25 front; #20 back, side to side                  PT Short Term Goals - 02/12/17 0943      PT SHORT TERM GOAL #2   Title Pt will perform gait with normalized gait pattern 100' with LRAD   Time 4   Period Weeks   Status Achieved           PT Long Term Goals - 02/12/17 1008      PT LONG TERM GOAL #1   Title Pt will be independent in advanced HEP   Time 8   Period Weeks   Status On-going     PT LONG TERM GOAL #2   Title Pt will improve FOTO to <31% to demo functional improvement   Time 8   Period Weeks   Status On-going  PT LONG TERM GOAL #3   Title Pt will improve Rt knee ROM to [facility] to improve gait and stair negotiation   Time 8   Period Weeks   Status On-going     PT LONG TERM GOAL #4   Title Pt will perform gait without AD for household and community distances   Time 8   Period Weeks   Status Achieved     PT LONG TERM GOAL #5   Title Pt will improve Rt hip and knee strength to 4+/5 to improve activity tolerance to return to work   Time 8   Period Weeks   Status On-going               Plan - 02/12/17 1013    Clinical Impression Statement Pt improving with stairs and improved strength and balance.  He is able to do more standing and walking.  He continues to have pain with walking more than 45 minutes.  Continues to have weakness on right LE > than left and needs cues and exercises focusing on proprioception for improved functional activities.   Rehab Potential Good   PT Treatment/Interventions ADLs/Self Care Home Management;Iontophoresis 4mg /ml Dexamethasone;Moist  Heat;Cryotherapy;Ultrasound;Therapeutic activities;Electrical Stimulation;Gait training;DME Instruction;Stair training;Functional mobility training;Patient/family education;Manual techniques;Neuromuscular re-education;Balance training;Therapeutic exercise;Orthotic Fit/Training;Passive range of motion;Dry needling;Taping;Vasopneumatic Device   PT Next Visit Plan Progress balance and single leg improve LE strength and stairs   Consulted and Agree with Plan of Care Patient      Patient will benefit from skilled therapeutic intervention in order to improve the following deficits and impairments:  Abnormal gait, Decreased balance, Decreased endurance, Decreased activity tolerance, Hypermobility, Pain, Decreased range of motion, Difficulty walking  Visit Diagnosis: Difficulty in walking, not elsewhere classified  Muscle weakness (generalized)     Problem List Patient Active Problem List   Diagnosis Date Noted  . Closed fracture of lateral portion of right tibial plateau 12/04/2016  . Cellulitis of hand 08/12/2012  . Drug addiction ([place]) 09/11/2011  . 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 Asthma 03/28/2010    Zannie Cove, PT 02/12/2017, 10:18 AM  [facility] [address] [place], [place], [address] Phone: [phone]   Fax:  [phone]  Name: [name] MRN: [id number] Date of Birth: [birth year]

## 2017-02-14 ENCOUNTER — Encounter: Payer: Self-pay | Admitting: Physical Therapy

## 2017-02-14 ENCOUNTER — Ambulatory Visit: Payer: Self-pay | Admitting: Physical Therapy

## 2017-02-14 DIAGNOSIS — R262 Difficulty in walking, not elsewhere classified: Secondary | ICD-10-CM

## 2017-02-14 DIAGNOSIS — M6281 Muscle weakness (generalized): Secondary | ICD-10-CM

## 2017-02-14 NOTE — Therapy (Addendum)
[facility] [address], [place], [place], [address] Phone: [phone]   Fax:  [phone]  Physical Therapy Treatment  Patient Details  Name: [name] MRN: [id number] Date of Birth: [birth year] Referring Provider: Frankey Shown  Encounter Date: 02/14/2017      PT End of Session - 02/14/17 0938    Visit Number 10   Number of Visits 16   Date for PT Re-Evaluation 03/06/17   PT Start Time 0935   PT Stop Time 1015   PT Time Calculation (min) 40 min   Activity Tolerance Patient tolerated treatment well   Behavior During Therapy [facility] for tasks assessed/performed      Past Medical History:  Diagnosis Date  . Asthma   . 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 Tendonitis     Past Surgical History:  Procedure Laterality Date  . INCISION AND DRAINAGE OF WOUND  08/12/2012   Procedure: IRRIGATION AND DEBRIDEMENT WOUND;  Surgeon: Tennis Must, MD;  Location: [place];  Service: Orthopedics;  Laterality: Right;  . WISDOM TOOTH EXTRACTION      There were no vitals filed for this visit.      Subjective Assessment - 02/14/17 0937    Subjective I haven't had any pain since last time I was here.  Started driving a few days ago.  I have been able to carry heavy grocery bags all at once.   Limitations Standing;Walking   How long can you sit comfortably? < 30 min   How long can you stand comfortably? 10-15 min   How long can you walk comfortably? 45 min   Currently in Pain? No/denies                         [facility] Adult PT Treatment/Exercise - 02/14/17 0001      Therapeutic Activites    Other Therapeutic Activities Standing, walking with improved gait mechanics, and weight shifting to improve weight bearing activities     Knee/Hip Exercises: Stretches   Active Hamstring Stretch Both;3 reps;20 seconds   Quad Stretch Right;3 reps;20 seconds   Gastroc Stretch Both;3 reps;20 seconds     Knee/Hip Exercises: Aerobic   Elliptical L3 x 8 minutes  therapist present to discuss treatment     Knee/Hip Exercises: Machines for Strengthening   Total Gym Leg Press Seat 7 2x10 #120 Bil; single leg 60# 2x10; 75# 10x     Knee/Hip Exercises: Standing   Knee Flexion Strengthening;Both;2 sets;10 reps  2#   Lateral Step Up Hand Hold: 1;20 reps;Right  BOSU   Forward Step Up Right;Left;2 sets;10 reps;Hand Hold: 1  BOSU   Wall Squat 20 reps;5 seconds  ball behind back   Other Standing Knee Exercises star reaches with slider - 5 x each side  difficulty keeping knee in alignemnt                  PT Short Term Goals - 02/12/17 0943      PT SHORT TERM GOAL #2   Title Pt will perform gait with normalized gait pattern 100' with LRAD   Time 4   Period Weeks   Status Achieved           PT Long Term Goals - 02/12/17 1008      PT LONG TERM GOAL #1   Title Pt will be independent in advanced HEP   Time 8   Period Weeks   Status  On-going     PT LONG TERM GOAL #2   Title Pt will improve FOTO to <31% to demo functional improvement   Time 8   Period Weeks   Status On-going     PT LONG TERM GOAL #3   Title Pt will improve Rt knee ROM to [facility] to improve gait and stair negotiation   Time 8   Period Weeks   Status On-going     PT LONG TERM GOAL #4   Title Pt will perform gait without AD for household and community distances   Time 8   Period Weeks   Status Achieved     PT LONG TERM GOAL #5   Title Pt will improve Rt hip and knee strength to 4+/5 to improve activity tolerance to return to work   Time 8   Period Weeks   Status On-going               Plan - 02/14/17 0939    Clinical Impression Statement Pt needs cues for slowing down and for correct alignment.  He is able to tolerate more resistance and difficulty with exercises.  Demonstrates improved gait mechanics.  Continues to need skilled PT for improved strength as he transitions back to full time.    Rehab Potential Good   PT Treatment/Interventions ADLs/Self Care Home Management;Iontophoresis 58m/ml Dexamethasone;Moist Heat;Cryotherapy;Ultrasound;Therapeutic activities;Electrical Stimulation;Gait training;DME Instruction;Stair training;Functional mobility training;Patient/family education;Manual techniques;Neuromuscular re-education;Balance training;Therapeutic exercise;Orthotic Fit/Training;Passive range of motion;Dry needling;Taping;Vasopneumatic Device   PT Next Visit Plan Progress balance and single leg improve LE strength and stairs   Consulted and Agree with Plan of Care Patient      Patient will benefit from skilled therapeutic intervention in order to improve the following deficits and impairments:  Abnormal gait, Decreased balance, Decreased endurance, Decreased activity tolerance, Hypermobility, Pain, Decreased range of motion, Difficulty walking  Visit Diagnosis: Difficulty in walking, not elsewhere classified  Muscle weakness (generalized)     Problem List Patient Active Problem List   Diagnosis Date Noted  . Closed fracture of lateral portion of right tibial plateau 12/04/2016  . Cellulitis of hand 08/12/2012  . [place] 09/11/2011  . 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 Asthma 03/28/2010    JZannie Cove PT 02/14/2017, 10:11 AM  [facility] [address] [place] [address]Phone: [phone]  Fax:  [phone] Name: Kevin SchraderMRN: [id number] of Birth: [birth year] PHYSICAL THERAPY DISCHARGE SUMMARY  Visits from Start of Care: 10  Current functional level related to goals / functional outcomes: See above goals   Remaining deficits: See above notes   Education / Equipment: HEP  Plan: Patient agrees to discharge.  Patient goals were partially met. Patient is being discharged due to not returning since the last visit.  ?????        JGoogle PT 04/02/17  8:11 AM

## 2017-03-14 MED FILL — ACETAMINOPHEN/COD #3 TABLET: 300-30 | 2 days supply | Qty: 20 | Fill #0

## 2017-06-17 ENCOUNTER — Ambulatory Visit: Payer: Self-pay | Attending: Internal Medicine

## 2017-08-26 ENCOUNTER — Encounter (INDEPENDENT_AMBULATORY_CARE_PROVIDER_SITE_OTHER): Payer: Self-pay | Admitting: Orthopaedic Surgery

## 2017-08-26 ENCOUNTER — Telehealth (INDEPENDENT_AMBULATORY_CARE_PROVIDER_SITE_OTHER): Payer: Self-pay | Admitting: Orthopaedic Surgery

## 2017-08-26 ENCOUNTER — Ambulatory Visit (INDEPENDENT_AMBULATORY_CARE_PROVIDER_SITE_OTHER): Payer: Self-pay

## 2017-08-26 ENCOUNTER — Ambulatory Visit (INDEPENDENT_AMBULATORY_CARE_PROVIDER_SITE_OTHER): Payer: Self-pay | Admitting: Orthopaedic Surgery

## 2017-08-26 ENCOUNTER — Other Ambulatory Visit (INDEPENDENT_AMBULATORY_CARE_PROVIDER_SITE_OTHER): Payer: Self-pay

## 2017-08-26 DIAGNOSIS — M25561 Pain in right knee: Secondary | ICD-10-CM

## 2017-08-26 NOTE — Progress Notes (Signed)
Office Visit Note   Patient: [name]           Date of Birth: Mar 21, 1991           MRN: [id number] Visit Date: 08/26/2017              Requested by: Clent Demark, PA-C [place], [place] [address] PCP: Clent Demark, PA-C   Assessment & Plan: Visit Diagnoses:  1. Acute pain of right knee     Plan: Impression is right knee pain rule out medial meniscal tear and occult fracture.  MRI ordered.  Follow-up after the MRI.  Follow-Up Instructions: Return in about 7 days (around 09/02/2017).   Orders:  Orders Placed This Encounter  Procedures  . XR KNEE 3 VIEW RIGHT   No orders of the defined types were placed in this encounter.     Procedures: No procedures performed   Clinical Data: No additional findings.   Subjective: Chief Complaint  Patient presents with  . Right Knee - Pain    [name] is a 26 year old gentleman that I treated approximately 7 months ago for a minimally depressed lateral tibial plateau fracture that was treated nonoperatively.  He comes in today with a new injury to the same right leg in which she felt multiple snapping and popping in his knee which caused him to fall to the ground.  He is now had significant pain and discomfort with weightbearing and he has started wearing the Bledsoe brace for support.  He denies any numbness and tingling.    Review of Systems  Constitutional: Negative.   All other systems reviewed and are negative.    Objective: Vital Signs: There were no vitals taken for this visit.  Physical Exam  Constitutional: He is oriented to person, place, and time. He appears well-developed and well-nourished.  Pulmonary/Chest: Effort normal.  Abdominal: Soft.  Neurological: He is alert and oriented to person, place, and time.  Skin: Skin is warm.  Psychiatric: He has a normal mood and affect. His behavior is normal. Judgment and thought content normal.  Nursing note and vitals  reviewed.   Ortho Exam Right knee exam shows no joint effusion.  Collaterals and cruciates are stable.  Medial joint line tenderness.Marland Kitchen  MCL is tender.  MCL is stable to testing. Specialty Comments:  No specialty comments available.  Imaging: Xr Knee 3 View Right  Result Date: 08/26/2017 No acute abnormalities    PMFS History: Patient Active Problem List   Diagnosis Date Noted  . Closed fracture of lateral portion of right tibial plateau 12/04/2016  . Cellulitis of hand 08/12/2012  . Drug addiction ([place]) 09/11/2011  . 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 Asthma 03/28/2010   Past Medical History:  Diagnosis Date  . Asthma   . 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 Tendonitis     History reviewed. No pertinent family history.  Past Surgical History:  Procedure Laterality Date  . WISDOM TOOTH EXTRACTION     Social History   Occupational History  . Not on file  Tobacco Use  . Smoking status: Current Every Day Smoker    Packs/day: 0.50    Years: 5.00    Pack years: 2.50  . Smokeless tobacco: Never Used  Substance and Sexual Activity  . Alcohol use: No    Comment: last used on 08/10/2012  . Drug use: Yes    Types: Heroin    Comment: on methadone program started ~ 03-2011    .  Sexual activity: Not on file

## 2017-08-26 NOTE — Telephone Encounter (Signed)
Use this number to sched MRI [phone]

## 2017-08-27 NOTE — Telephone Encounter (Signed)
Noted, # already in system

## 2017-08-27 NOTE — Telephone Encounter (Signed)
See message.  I guess you can attach note with number  On referral since you have access to it.

## 2017-09-02 ENCOUNTER — Ambulatory Visit (INDEPENDENT_AMBULATORY_CARE_PROVIDER_SITE_OTHER): Payer: Self-pay | Admitting: Orthopaedic Surgery

## 2017-09-05 ENCOUNTER — Ambulatory Visit
Admission: RE | Admit: 2017-09-05 | Discharge: 2017-09-05 | Disposition: A | Discharge status: Home / Self Care | Payer: Self-pay | Source: Ambulatory Visit | Attending: Orthopaedic Surgery | Admitting: Orthopaedic Surgery

## 2017-09-05 DIAGNOSIS — M25561 Pain in right knee: Secondary | ICD-10-CM

## 2017-09-06 ENCOUNTER — Ambulatory Visit (INDEPENDENT_AMBULATORY_CARE_PROVIDER_SITE_OTHER): Payer: Medicaid Other | Admitting: Orthopaedic Surgery

## 2017-09-06 DIAGNOSIS — M25561 Pain in right knee: Secondary | ICD-10-CM

## 2017-09-06 NOTE — Progress Notes (Signed)
   Office Visit Note   Patient: [name]           Date of Birth: 03-27-1991           MRN: [id number] Visit Date: 09/06/2017              Requested by: Clent Demark, PA-C [place], [place] [address] PCP: Clent Demark, PA-C   Assessment & Plan: Visit Diagnoses:  1. Acute pain of right knee     Plan: Right knee MRI essentially shows a sprain.  No acute findings.  Healed lateral tibial plateau fracture.  Hinged knee brace was provided today.  We discussed physical therapy which patient declined.  Follow-up as needed. Total face to face encounter time was greater than 25 minutes and over half of this time was spent in counseling and/or coordination of care.  Follow-Up Instructions: Return if symptoms worsen or fail to improve.   Orders:  No orders of the defined types were placed in this encounter.  No orders of the defined types were placed in this encounter.     Procedures: No procedures performed   Clinical Data: No additional findings.   Subjective: No chief complaint on file.   Johnson follows up today for his right knee MRI.  He feels slightly better.  He is able to ambulate and bear weight on his right leg denies any numbness and tingling.    Review of Systems  Constitutional: Negative.   All other systems reviewed and are negative.    Objective: Vital Signs: There were no vitals taken for this visit.  Physical Exam  Constitutional: He is oriented to person, place, and time. He appears well-developed and well-nourished.  Pulmonary/Chest: Effort normal.  Abdominal: Soft.  Neurological: He is alert and oriented to person, place, and time.  Skin: Skin is warm.  Psychiatric: He has a normal mood and affect. His behavior is normal. Judgment and thought content normal.  Nursing note and vitals reviewed.   Ortho Exam Right knee exam is stable.  Less tender and less symptomatic to exam Specialty Comments:  No specialty  comments available.  Imaging: No results found.   PMFS History: Patient Active Problem List   Diagnosis Date Noted  . Closed fracture of lateral portion of right tibial plateau 12/04/2016  . Cellulitis of hand 08/12/2012  . Drug addiction ([place]) 09/11/2011  . 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 Asthma 03/28/2010   Past Medical History:  Diagnosis Date  . Asthma   . 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 Tendonitis     No family history on file.  Past Surgical History:  Procedure Laterality Date  . INCISION AND DRAINAGE OF WOUND  08/12/2012   Procedure: IRRIGATION AND DEBRIDEMENT WOUND;  Surgeon: Tennis Must, MD;  Location: [place];  Service: Orthopedics;  Laterality: Right;  . WISDOM TOOTH EXTRACTION     Social History   Occupational History  . Not on file  Tobacco Use  . Smoking status: Current Every Day Smoker    Packs/day: 0.50    Years: 5.00    Pack years: 2.50  . Smokeless tobacco: Never Used  Substance and Sexual Activity  . Alcohol use: No    Comment: last used on 08/10/2012  . Drug use: Yes    Types: Heroin    Comment: on methadone program started ~ 03-2011    . Sexual activity: Not on file

## 2017-12-16 DIAGNOSIS — L0291 Cutaneous abscess, unspecified: Secondary | ICD-10-CM | POA: Insufficient documentation

## 2018-01-24 ENCOUNTER — Emergency Department (HOSPITAL_COMMUNITY)
Admission: EM | Admit: 2018-01-24 | Discharge: 2018-01-24 | Discharge status: Against Medical Advice | Payer: BLUE CROSS/BLUE SHIELD | Attending: Emergency Medicine | Admitting: Emergency Medicine

## 2018-01-24 ENCOUNTER — Other Ambulatory Visit: Payer: Self-pay

## 2018-01-24 ENCOUNTER — Encounter (HOSPITAL_COMMUNITY): Payer: Self-pay

## 2018-01-24 DIAGNOSIS — Z79899 Other long term (current) drug therapy: Secondary | ICD-10-CM | POA: Insufficient documentation

## 2018-01-24 DIAGNOSIS — F17210 Nicotine dependence, cigarettes, uncomplicated: Secondary | ICD-10-CM | POA: Insufficient documentation

## 2018-01-24 DIAGNOSIS — T401X1A Poisoning by heroin, accidental (unintentional), initial encounter: Secondary | ICD-10-CM | POA: Diagnosis not present

## 2018-01-24 DIAGNOSIS — J45909 Unspecified asthma, uncomplicated: Secondary | ICD-10-CM | POA: Diagnosis not present

## 2018-01-24 LAB — CBG MONITORING, ED: GLUCOSE-CAPILLARY: 132 mg/dL — AB (ref 65–99)

## 2018-01-24 NOTE — ED Notes (Signed)
Checked CBG 132, RN Lovena Le informed

## 2018-01-24 NOTE — ED Notes (Signed)
Pt given water 

## 2018-01-24 NOTE — ED Provider Notes (Signed)
[facility] EMERGENCY DEPARTMENT Provider Note   CSN: [id number] Arrival date & time: 01/24/18  2039     History   Chief Complaint Chief Complaint  Patient presents with  . Drug Overdose    HPI [name] is a 27 y.o. male.  HPI  27 year old male presents after heroin overdose. Patient was found unresponsive in his car in road. States he used more heroin than typical and that it was a different color. Was not trying to harm self, denies SI. Has multiple areas of skin irritaion, states he has cellulitis and is on doxycycline x 3 days. Wounds are improving. No acute complaints. Given narcan 1 mg intransal by fire and bagged by EMS until awake 10 min later. Denies any other current acute ingestants, but does use cocaine.   Past Medical History:  Diagnosis Date  . Asthma   . 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 Tendonitis     Patient Active Problem List   Diagnosis Date Noted  . Closed fracture of lateral portion of right tibial plateau 12/04/2016  . Cellulitis of hand 08/12/2012  . Drug addiction ([place]) 09/11/2011  . 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 Asthma 03/28/2010    Past Surgical History:  Procedure Laterality Date  . INCISION AND DRAINAGE OF WOUND  08/12/2012   Procedure: IRRIGATION AND DEBRIDEMENT WOUND;  Surgeon: Tennis Must, MD;  Location: [place];  Service: Orthopedics;  Laterality: Right;  . WISDOM TOOTH EXTRACTION          Home Medications    Prior to Admission medications   Medication Sig Start Date End Date Taking? Authorizing Provider  albuterol (PROVENTIL HFA;VENTOLIN HFA) 108 (90 Base) MCG/ACT inhaler Inhale 2 puffs into the lungs every 4 (four) hours as needed for wheezing or shortness of breath. 12/19/16 01/24/18 Yes Clent Demark, PA-C  ibuprofen (ADVIL,MOTRIN) 200 MG tablet Take 200-400 mg by mouth every 6 (six) hours as needed for headache or mild pain.    Yes [provider]  naproxen sodium (ALEVE) 220 MG  tablet Take 220-440 mg by mouth 2 (two) times daily as needed (for pain).    Yes [provider]  albuterol (PROVENTIL) (5 MG/ML) 0.5% nebulizer solution Take 0.5 mLs (2.5 mg total) by nebulization every 4 (four) hours as needed for wheezing or shortness of breath. Patient not taking: Reported on 01/24/2018 09/08/11   Samuella Cota, MD  ibuprofen (ADVIL,MOTRIN) 600 MG tablet Take 1 tablet (600 mg total) by mouth every 8 (eight) hours as needed. Patient not taking: Reported on 01/24/2018 12/19/16   Clent Demark, PA-C    Family History No family history on file.  Social History Social History   Tobacco Use  . Smoking status: Current Every Day Smoker    Packs/day: 0.50    Years: 5.00    Pack years: 2.50  . Smokeless tobacco: Never Used  Substance Use Topics  . Alcohol use: No    Comment: last used on 08/10/2012  . Drug use: Yes    Types: Heroin, IV, Cocaine    Comment: on methadone program started ~ 03-2011       Allergies   Vicodin [hydrocodone-acetaminophen] and Xanax [alprazolam]   Review of Systems Review of Systems  Respiratory: Negative for shortness of breath.   Cardiovascular: Negative for chest pain.  Skin: Positive for wound.  Psychiatric/Behavioral: Negative for suicidal ideas.  All other systems reviewed and are negative.    Physical Exam Updated  Vital Signs BP 126/62   Pulse 88   Temp 98.2 F (36.8 C) (Oral)   Resp 12   SpO2 97%   Physical Exam  Constitutional: He is oriented to person, place, and time. He appears well-developed and well-nourished.  HENT:  Head: Normocephalic and atraumatic.  Right Ear: External ear normal.  Left Ear: External ear normal.  Nose: Nose normal.  Eyes: Right eye exhibits no discharge. Left eye exhibits no discharge.  Neck: Neck supple.  Cardiovascular: Regular rhythm and normal heart sounds. Tachycardia present.  HR low 100s  Pulmonary/Chest: Effort normal and breath sounds normal.  Abdominal: Soft.  There is no tenderness.  Musculoskeletal: He exhibits no edema.  Neurological: He is alert and oriented to person, place, and time.  Skin: Skin is warm and dry. There is erythema.  Multiple subcutaneous nodules and scars to bilateral forearms. Proximal left forearm has superficial wound with purulent drainage. No fluctuance. No streaking  Psychiatric: He has a normal mood and affect. His mood appears not anxious. He does not exhibit a depressed mood. He expresses no suicidal ideation.  Nursing note and vitals reviewed.    ED Treatments / Results  Labs (all labs ordered are listed, but only abnormal results are displayed) Labs Reviewed  CBG MONITORING, ED - Abnormal; Notable for the following components:      Result Value   Glucose-Capillary 132 (*)    All other components within normal limits    EKG EKG Interpretation  Date/Time:  Friday January 24 2018 20:54:49 EDT Ventricular Rate:  105 PR Interval:    QRS Duration: 103 QT Interval:  345 QTC Calculation: 456 R Axis:   88 Text Interpretation:  Sinus tachycardia Anterolateral Q wave, probably normal for age ST elev, probable normal early repol pattern No old tracing to compare Confirmed by Sherwood Gambler [phone]) on 01/24/2018 10:09:20 PM   Radiology No results found.  Procedures Procedures (including critical care time)  Medications Ordered in ED Medications - No data to display   Initial Impression / Assessment and Plan / ED Course  I have reviewed the triage vital signs and the nursing notes.  Pertinent labs & imaging results that were available during my care of the patient were reviewed by me and considered in my medical decision making (see chart for details).     Patient appears stable. Denies SI. Mildly tachycardic but no hypoxia or hypotension. No labs needed. CBG benign. Will plan to observe 2-3 hours post narcan to ensure no relapse. Appears to have superficial infections from injections but no obvious  drainable abscess.  Care to Quincy Carnes at 10 pm  Final Clinical Impressions(s) / ED Diagnoses   Final diagnoses:  Accidental overdose of heroin, initial encounter [facility])    ED Discharge Orders    None       Sherwood Gambler, MD 01/24/18 2239

## 2018-01-24 NOTE — ED Triage Notes (Signed)
Pt BIB [facility] for heroin overdose, last use approx 730pm. Pt found unresponsive and not breathing by fire, 1mg  Narcan intranasally given. EMS arrived and bagged pt for about 84mins and pt came to. Upon arrival to Ed pt a.o, ambulatory, able to answer all questions. NAD at this time
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
## 2018-01-24 NOTE — ED Notes (Signed)
ED Provider at bedside. 

## 2018-02-16 ENCOUNTER — Emergency Department (HOSPITAL_COMMUNITY)
Admission: EM | Admit: 2018-02-16 | Discharge: 2018-02-16 | Payer: BLUE CROSS/BLUE SHIELD | Attending: Emergency Medicine | Admitting: Emergency Medicine

## 2018-02-16 ENCOUNTER — Emergency Department (HOSPITAL_COMMUNITY): Payer: BLUE CROSS/BLUE SHIELD

## 2018-02-16 ENCOUNTER — Other Ambulatory Visit: Payer: Self-pay

## 2018-02-16 ENCOUNTER — Encounter (HOSPITAL_COMMUNITY): Payer: Self-pay

## 2018-02-16 DIAGNOSIS — R51 Headache: Secondary | ICD-10-CM | POA: Insufficient documentation

## 2018-02-16 DIAGNOSIS — T401X1A Poisoning by heroin, accidental (unintentional), initial encounter: Secondary | ICD-10-CM | POA: Insufficient documentation

## 2018-02-16 DIAGNOSIS — M791 Myalgia, unspecified site: Secondary | ICD-10-CM | POA: Diagnosis not present

## 2018-02-16 DIAGNOSIS — J45909 Unspecified asthma, uncomplicated: Secondary | ICD-10-CM | POA: Diagnosis not present

## 2018-02-16 DIAGNOSIS — T50901A Poisoning by unspecified drugs, medicaments and biological substances, accidental (unintentional), initial encounter: Secondary | ICD-10-CM

## 2018-02-16 DIAGNOSIS — F17210 Nicotine dependence, cigarettes, uncomplicated: Secondary | ICD-10-CM | POA: Diagnosis not present

## 2018-02-16 MED ORDER — KETOROLAC TROMETHAMINE 60 MG/2ML IM SOLN
30.0000 mg | Freq: Once | INTRAMUSCULAR | Status: AC
  Administered 2018-02-16: 30 mg via INTRAMUSCULAR
  Filled 2018-02-16: qty 2

## 2018-02-16 MED ORDER — ALBUTEROL SULFATE HFA 108 (90 BASE) MCG/ACT IN AERS
2.0000 | INHALATION_SPRAY | RESPIRATORY_TRACT | Status: DC | PRN
  Administered 2018-02-16: 2 via RESPIRATORY_TRACT
  Filled 2018-02-16: qty 6.7
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
## 2018-02-16 NOTE — Discharge Instructions (Addendum)
It was my pleasure taking care of you today!   Fortunately, your imaging today was normal.   Please quit doing heroin.   Return to ER for new or worsening symptoms, any additional concerns.
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
## 2018-04-08 ENCOUNTER — Ambulatory Visit (INDEPENDENT_AMBULATORY_CARE_PROVIDER_SITE_OTHER): Payer: Medicaid Other | Admitting: Physician Assistant

## 2018-12-26 ENCOUNTER — Emergency Department (HOSPITAL_BASED_OUTPATIENT_CLINIC_OR_DEPARTMENT_OTHER)
Admission: EM | Admit: 2018-12-26 | Discharge: 2018-12-26 | Disposition: A | Discharge status: Home / Self Care | Payer: No Typology Code available for payment source | Attending: Emergency Medicine | Admitting: Emergency Medicine

## 2018-12-26 ENCOUNTER — Encounter (HOSPITAL_BASED_OUTPATIENT_CLINIC_OR_DEPARTMENT_OTHER): Payer: Self-pay | Admitting: *Deleted

## 2018-12-26 ENCOUNTER — Emergency Department (HOSPITAL_BASED_OUTPATIENT_CLINIC_OR_DEPARTMENT_OTHER): Payer: No Typology Code available for payment source

## 2018-12-26 ENCOUNTER — Other Ambulatory Visit: Payer: Self-pay

## 2018-12-26 DIAGNOSIS — J189 Pneumonia, unspecified organism: Secondary | ICD-10-CM | POA: Diagnosis not present

## 2018-12-26 DIAGNOSIS — R079 Chest pain, unspecified: Secondary | ICD-10-CM | POA: Insufficient documentation

## 2018-12-26 DIAGNOSIS — R066 Hiccough: Secondary | ICD-10-CM | POA: Diagnosis not present

## 2018-12-26 DIAGNOSIS — F17210 Nicotine dependence, cigarettes, uncomplicated: Secondary | ICD-10-CM | POA: Insufficient documentation

## 2018-12-26 DIAGNOSIS — R109 Unspecified abdominal pain: Secondary | ICD-10-CM | POA: Diagnosis not present

## 2018-12-26 DIAGNOSIS — J45909 Unspecified asthma, uncomplicated: Secondary | ICD-10-CM | POA: Diagnosis not present

## 2018-12-26 DIAGNOSIS — Z79899 Other long term (current) drug therapy: Secondary | ICD-10-CM | POA: Diagnosis not present

## 2018-12-26 DIAGNOSIS — R05 Cough: Secondary | ICD-10-CM | POA: Diagnosis present

## 2018-12-26 LAB — COMPREHENSIVE METABOLIC PANEL
ALK PHOS: 127 U/L — AB (ref 38–126)
ALT: 71 U/L — AB (ref 0–44)
AST: 79 U/L — ABNORMAL HIGH (ref 15–41)
Albumin: 3.4 g/dL — ABNORMAL LOW (ref 3.5–5.0)
Anion gap: 8 (ref 5–15)
BUN: 7 mg/dL (ref 6–20)
CALCIUM: 8.8 mg/dL — AB (ref 8.9–10.3)
CHLORIDE: 100 mmol/L (ref 98–111)
CO2: 24 mmol/L (ref 22–32)
CREATININE: 0.85 mg/dL (ref 0.61–1.24)
Glucose, Bld: 110 mg/dL — ABNORMAL HIGH (ref 70–99)
Potassium: 3.6 mmol/L (ref 3.5–5.1)
Sodium: 132 mmol/L — ABNORMAL LOW (ref 135–145)
Total Bilirubin: 0.4 mg/dL (ref 0.3–1.2)
Total Protein: 7.2 g/dL (ref 6.5–8.1)

## 2018-12-26 LAB — CBC WITH DIFFERENTIAL/PLATELET
Abs Immature Granulocytes: 0.01 10*3/uL (ref 0.00–0.07)
BASOS ABS: 0 10*3/uL (ref 0.0–0.1)
Basophils Relative: 0 %
EOS PCT: 1 %
Eosinophils Absolute: 0 10*3/uL (ref 0.0–0.5)
HEMATOCRIT: 37.1 % — AB (ref 39.0–52.0)
HEMOGLOBIN: 12.1 g/dL — AB (ref 13.0–17.0)
Immature Granulocytes: 0 %
Lymphocytes Relative: 38 %
Lymphs Abs: 1.1 10*3/uL (ref 0.7–4.0)
MCH: 27.3 pg (ref 26.0–34.0)
MCHC: 32.6 g/dL (ref 30.0–36.0)
MCV: 83.6 fL (ref 80.0–100.0)
Monocytes Absolute: 0.4 10*3/uL (ref 0.1–1.0)
Monocytes Relative: 12 %
NRBC: 0 % (ref 0.0–0.2)
Neutro Abs: 1.4 10*3/uL — ABNORMAL LOW (ref 1.7–7.7)
Neutrophils Relative %: 49 %
Platelets: 137 10*3/uL — ABNORMAL LOW (ref 150–400)
RBC: 4.44 MIL/uL (ref 4.22–5.81)
RDW: 13.2 % (ref 11.5–15.5)
WBC: 2.9 10*3/uL — AB (ref 4.0–10.5)

## 2018-12-26 MED ORDER — ACETAMINOPHEN 325 MG PO TABS
650.0000 mg | ORAL_TABLET | Freq: Once | ORAL | Status: AC
  Administered 2018-12-26: 650 mg via ORAL
  Filled 2018-12-26: qty 2

## 2018-12-26 MED ORDER — IOHEXOL 300 MG/ML  SOLN
100.0000 mL | Freq: Once | INTRAMUSCULAR | Status: AC | PRN
  Administered 2018-12-26: 100 mL via INTRAVENOUS

## 2018-12-26 MED ORDER — KETOROLAC TROMETHAMINE 30 MG/ML IJ SOLN
30.0000 mg | Freq: Once | INTRAMUSCULAR | Status: AC
  Administered 2018-12-26: 30 mg via INTRAVENOUS
  Filled 2018-12-26: qty 1

## 2018-12-26 MED ORDER — SODIUM CHLORIDE 0.9 % IV BOLUS
1000.0000 mL | Freq: Once | INTRAVENOUS | Status: AC
  Administered 2018-12-26: 1000 mL via INTRAVENOUS

## 2018-12-26 MED ORDER — DOXYCYCLINE HYCLATE 100 MG PO TABS
100.0000 mg | ORAL_TABLET | Freq: Once | ORAL | Status: AC
  Administered 2018-12-26: 100 mg via ORAL
  Filled 2018-12-26: qty 1

## 2018-12-26 MED ORDER — METHOCARBAMOL 500 MG PO TABS
500.0000 mg | ORAL_TABLET | Freq: Once | ORAL | Status: AC
  Administered 2018-12-26: 500 mg via ORAL
  Filled 2018-12-26: qty 1

## 2018-12-26 MED ORDER — DOXYCYCLINE HYCLATE 100 MG PO CAPS: 100.0000 mg | ORAL_CAPSULE | Freq: Two times a day (BID) | ORAL | 0 refills | Status: AC

## 2018-12-26 NOTE — ED Provider Notes (Signed)
[facility] EMERGENCY DEPARTMENT Provider Note   CSN: [id number] Arrival date & time: 12/26/18  1407    History   Chief Complaint Chief Complaint  Patient presents with  . Fever  . Cough    HPI [name] is a 28 y.o. male with PMHx IVDU, methadone dependence, asthma, presenting to the ED with complaint of cough that began 2 days ago.  Patient states he has had a cough that is intermittently productive of mucus, mostly at night.  He states along with the cough he developed hiccups.  Hiccups are persistent throughout the day with relief while he sleeps, however once he wakes up the hiccups resume.  He has felt some chest tightness, however treated with albuterol nebulizer prior to arrival with improvement.  He states he has now currently breathing at his baseline and is not short of breath.  Reports fever at home of 100 F.  He has associated headache.  No medications tried prior to arrival.  Denies nasal congestion, ear pain, abdominal complaints.  Has not had the influenza vaccine this season.  Denies current IV drug use.     The history is provided by the patient.    Past Medical History:  Diagnosis Date  . Asthma   . Methadone dependence ([place])   . Tendonitis     Patient Active Problem List   Diagnosis Date Noted  . Closed fracture of lateral portion of right tibial plateau 12/04/2016  . Cellulitis of hand 08/12/2012  . Drug addiction ([place]) 09/11/2011  . 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 Asthma 03/28/2010    Past Surgical History:  Procedure Laterality Date  . INCISION AND DRAINAGE OF WOUND  08/12/2012   Procedure: IRRIGATION AND DEBRIDEMENT WOUND;  Surgeon: Tennis Must, MD;  Location: [facility];  Service: Orthopedics;  Laterality: Right;  . WISDOM TOOTH EXTRACTION          Home Medications    Prior to Admission medications   Medication Sig Start Date End Date Taking? Authorizing Provider  methadone (DOLOPHINE) 1 mg/ml oral solution Take  by mouth daily. 80 mg a day   Yes [provider]  sertraline (ZOLOFT) 50 MG tablet Take 50 mg by mouth daily.   Yes [provider]  albuterol (PROVENTIL HFA;VENTOLIN HFA) 108 (90 Base) MCG/ACT inhaler Inhale 2 puffs into the lungs every 4 (four) hours as needed for wheezing or shortness of breath. 12/19/16 01/24/18  Clent Demark, PA-C  albuterol (PROVENTIL) (5 MG/ML) 0.5% nebulizer solution Take 0.5 mLs (2.5 mg total) by nebulization every 4 (four) hours as needed for wheezing or shortness of breath. Patient not taking: Reported on 01/24/2018 09/08/11   Samuella Cota, MD  doxycycline (VIBRAMYCIN) 100 MG capsule Take 1 capsule (100 mg total) by mouth 2 (two) times daily for 7 days. 12/26/18 01/02/19  Robinson, [place] N, PA-C  ibuprofen (ADVIL,MOTRIN) 200 MG tablet Take 200-400 mg by mouth every 6 (six) hours as needed for headache or mild pain.     [provider]  ibuprofen (ADVIL,MOTRIN) 600 MG tablet Take 1 tablet (600 mg total) by mouth every 8 (eight) hours as needed. Patient not taking: Reported on 01/24/2018 12/19/16   Clent Demark, PA-C  naproxen sodium (ALEVE) 220 MG tablet Take 220-440 mg by mouth 2 (two) times daily as needed (for pain).     [provider]    Family History No family history on file.  Social History Social History   Tobacco  Use  . Smoking status: Current Every Day Smoker    Packs/day: 0.50    Years: 5.00    Pack years: 2.50    Types: Cigarettes  . Smokeless tobacco: Never Used  Substance Use Topics  . Alcohol use: Not Currently    Comment: last used on 08/10/2012  . Drug use: Not Currently    Types: Heroin, IV, Cocaine    Comment: on methadone program started ~ 03-2011       Allergies   Vicodin [hydrocodone-acetaminophen] and Xanax [alprazolam]   Review of Systems Review of Systems  Constitutional: Positive for chills and fever.  HENT: Negative for congestion, ear pain, rhinorrhea and sore throat.     Respiratory: Positive for cough and shortness of breath.   Gastrointestinal: Positive for abdominal pain (only with hiccups).  Neurological: Positive for headaches.  All other systems reviewed and are negative.    Physical Exam Updated Vital Signs BP (!) 125/111 (BP Location: Left Arm) Comment: pt will not hold still  Pulse 98   Temp 98.6 F (37 C) (Oral) Comment: PT feels very hot to touch  Resp 18   Ht 6\' 3"  (1.905 m)   Wt 86.2 kg   SpO2 97%   BMI 23.75 kg/m   Physical Exam Vitals signs and nursing note reviewed.  Constitutional:      General: He is not in acute distress.    Appearance: He is well-developed.     Comments: hiccuping throughout exam  HENT:     Head: Normocephalic and atraumatic.     Mouth/Throat:     Mouth: Mucous membranes are moist.     Comments: Mild pharyngeal erythema without exudates or edema.  Uvula midline.  Tolerate secretions. Eyes:     Extraocular Movements: Extraocular movements intact.     Conjunctiva/sclera: Conjunctivae normal.     Pupils: Pupils are equal, round, and reactive to light.  Neck:     Musculoskeletal: Normal range of motion and neck supple. No neck rigidity.  Cardiovascular:     Rate and Rhythm: Normal rate and regular rhythm.  Pulmonary:     Effort: Pulmonary effort is normal. No respiratory distress.     Comments: Faint wheezes right LL Abdominal:     General: Bowel sounds are normal. There is no distension.     Palpations: Abdomen is soft.     Tenderness: There is no abdominal tenderness. There is no guarding.  Lymphadenopathy:     Cervical: No cervical adenopathy.  Skin:    General: Skin is warm.     Comments: Multiple small erythematous papules (about pea-sized) to b/l hands and forearms.  No fluctuance or drainage.  Multiple track marks consistent with IVDU present.  Neurological:     Mental Status: He is alert.  Psychiatric:        Behavior: Behavior normal.      ED Treatments / Results  Labs (all labs  ordered are listed, but only abnormal results are displayed) Labs Reviewed  COMPREHENSIVE METABOLIC PANEL - Abnormal; Notable for the following components:      Result Value   Sodium 132 (*)    Glucose, Bld 110 (*)    Calcium 8.8 (*)    Albumin 3.4 (*)    AST 79 (*)    ALT 71 (*)    Alkaline Phosphatase 127 (*)    All other components within normal limits  CBC WITH DIFFERENTIAL/PLATELET - Abnormal; Notable for the following components:   WBC 2.9 (*)  Hemoglobin 12.1 (*)    HCT 37.1 (*)    Platelets 137 (*)    Neutro Abs 1.4 (*)    All other components within normal limits  CULTURE, BLOOD (ROUTINE X 2)  CULTURE, BLOOD (ROUTINE X 2)    EKG None  Radiology Dg Chest 2 View  Result Date: 12/26/2018 CLINICAL DATA:  Cough, fever and chills EXAM: CHEST - 2 VIEW COMPARISON:  Chest radiograph 10/30/2016. FINDINGS: Normal cardiac and mediastinal contours. No consolidative pulmonary opacities. No pleural effusion or pneumothorax. Regional skeleton is unremarkable. IMPRESSION: No acute cardiopulmonary process. Electronically Signed   By: Lovey Newcomer M.D.   On: 12/26/2018 15:29   Ct Chest W Contrast  Result Date: 12/26/2018 CLINICAL DATA:  Cough, fever, fatigue, hiccups and dizziness for 1 day. Abdominal pain. History of asthma. Smoker. History of IV drug use. EXAM: CT CHEST, ABDOMEN, AND PELVIS WITH CONTRAST TECHNIQUE: Multidetector CT imaging of the chest, abdomen and pelvis was performed following the standard protocol during bolus administration of intravenous contrast. CONTRAST:  115mL OMNIPAQUE IOHEXOL 300 MG/ML  SOLN COMPARISON:  Chest radiographs obtained earlier today. Lumbar spine radiographs dated 02/16/2018. FINDINGS: CT CHEST FINDINGS Cardiovascular: No significant vascular findings. Normal heart size. No pericardial effusion. Mediastinum/Nodes: Enlarged subcarinal and right hilar lymph nodes. These include a right hilar node with a short axis diameter 13 mm on image number 32 series  2 and a subcarinal node with a short axis diameter of 11 mm on image number 37 series 2. Normal thymus gland, thyroid gland and esophagus. Lungs/Pleura: Mild to moderate diffuse peribronchial thickening. Multiple small, ill-defined areas of patchy ground-glass opacity in the anterior right lower lobe and inferior right middle lobe. The remainder of the lungs are clear. No pleural fluid. Musculoskeletal: Minimal thoracic and lower cervical spine degenerative changes. CT ABDOMEN PELVIS FINDINGS Hepatobiliary: Moderate intrahepatic and extrahepatic biliary ductal dilatation. The proximal common duct measures 14.9 mm in diameter. The common duct is dilated to the level of the medial aspect of the head of the pancreas, with no visible obstructing stone or mass. No CT visible gallstones in the gallbladder. The gallbladder is mildly dilated. Pancreas: Dilated common duct in the head of the pancreas. The remainder the pancreas has a normal appearance. Spleen: Borderline enlarged, measuring 13.0 cm in length. Adrenals/Urinary Tract: Adrenal glands are unremarkable. Kidneys are normal, without renal calculi, focal lesion, or hydronephrosis. Bladder is unremarkable. Stomach/Bowel: Prominent stool throughout the majority of the colon. Unremarkable stomach and small bowel. No evidence of appendicitis. Vascular/Lymphatic: No significant vascular findings are present. No enlarged abdominal or pelvic lymph nodes. Reproductive: Prostate is unremarkable. Other: No abdominal wall hernia or abnormality. No abdominopelvic ascites. Musculoskeletal: Unremarkable bones. Scattered small bone islands. IMPRESSION: 1. Multiple small, ill-defined areas of patchy ground-glass opacity in the anterior right lower lobe and inferior right middle lobe. This is compatible with an infectious process. 2. Mild mediastinal and right hilar adenopathy, most likely reactive. 3. Mild to moderate bronchitic changes. 4. Moderate intrahepatic and extrahepatic  biliary ductal dilatation to the level of the distal common duct in the head of the pancreas. This could be due to a nonvisualized distal common duct stone or stricture. This could be further evaluated with an MRCP. 5. Borderline splenomegaly. 6. Prominent stool throughout the majority of the colon. Electronically Signed   By: Claudie Revering M.D.   On: 12/26/2018 16:34   Ct Abdomen Pelvis W Contrast  Result Date: 12/26/2018 CLINICAL DATA:  Cough, fever, fatigue, hiccups and dizziness  for 1 day. Abdominal pain. History of asthma. Smoker. History of IV drug use. EXAM: CT CHEST, ABDOMEN, AND PELVIS WITH CONTRAST TECHNIQUE: Multidetector CT imaging of the chest, abdomen and pelvis was performed following the standard protocol during bolus administration of intravenous contrast. CONTRAST:  119mL OMNIPAQUE IOHEXOL 300 MG/ML  SOLN COMPARISON:  Chest radiographs obtained earlier today. Lumbar spine radiographs dated 02/16/2018. FINDINGS: CT CHEST FINDINGS Cardiovascular: No significant vascular findings. Normal heart size. No pericardial effusion. Mediastinum/Nodes: Enlarged subcarinal and right hilar lymph nodes. These include a right hilar node with a short axis diameter 13 mm on image number 32 series 2 and a subcarinal node with a short axis diameter of 11 mm on image number 37 series 2. Normal thymus gland, thyroid gland and esophagus. Lungs/Pleura: Mild to moderate diffuse peribronchial thickening. Multiple small, ill-defined areas of patchy ground-glass opacity in the anterior right lower lobe and inferior right middle lobe. The remainder of the lungs are clear. No pleural fluid. Musculoskeletal: Minimal thoracic and lower cervical spine degenerative changes. CT ABDOMEN PELVIS FINDINGS Hepatobiliary: Moderate intrahepatic and extrahepatic biliary ductal dilatation. The proximal common duct measures 14.9 mm in diameter. The common duct is dilated to the level of the medial aspect of the head of the pancreas, with no  visible obstructing stone or mass. No CT visible gallstones in the gallbladder. The gallbladder is mildly dilated. Pancreas: Dilated common duct in the head of the pancreas. The remainder the pancreas has a normal appearance. Spleen: Borderline enlarged, measuring 13.0 cm in length. Adrenals/Urinary Tract: Adrenal glands are unremarkable. Kidneys are normal, without renal calculi, focal lesion, or hydronephrosis. Bladder is unremarkable. Stomach/Bowel: Prominent stool throughout the majority of the colon. Unremarkable stomach and small bowel. No evidence of appendicitis. Vascular/Lymphatic: No significant vascular findings are present. No enlarged abdominal or pelvic lymph nodes. Reproductive: Prostate is unremarkable. Other: No abdominal wall hernia or abnormality. No abdominopelvic ascites. Musculoskeletal: Unremarkable bones. Scattered small bone islands. IMPRESSION: 1. Multiple small, ill-defined areas of patchy ground-glass opacity in the anterior right lower lobe and inferior right middle lobe. This is compatible with an infectious process. 2. Mild mediastinal and right hilar adenopathy, most likely reactive. 3. Mild to moderate bronchitic changes. 4. Moderate intrahepatic and extrahepatic biliary ductal dilatation to the level of the distal common duct in the head of the pancreas. This could be due to a nonvisualized distal common duct stone or stricture. This could be further evaluated with an MRCP. 5. Borderline splenomegaly. 6. Prominent stool throughout the majority of the colon. Electronically Signed   By: Claudie Revering M.D.   On: 12/26/2018 16:34    Procedures Procedures (including critical care time)  Medications Ordered in ED Medications  acetaminophen (TYLENOL) tablet 650 mg (650 mg Oral Given 12/26/18 1449)  methocarbamol (ROBAXIN) tablet 500 mg (500 mg Oral Given 12/26/18 1519)  sodium chloride 0.9 % bolus 1,000 mL (0 mLs Intravenous Stopped 12/26/18 1645)  ketorolac (TORADOL) 30 MG/ML  injection 30 mg (30 mg Intravenous Given 12/26/18 1518)  iohexol (OMNIPAQUE) 300 MG/ML solution 100 mL (100 mLs Intravenous Contrast Given 12/26/18 1556)  doxycycline (VIBRA-TABS) tablet 100 mg (100 mg Oral Given 12/26/18 1700)     Initial Impression / Assessment and Plan / ED Course  I have reviewed the triage vital signs and the nursing notes.  Pertinent labs & imaging results that were available during my care of the patient were reviewed by me and considered in my medical decision making (see chart for details).  Patient with history of IV drug use, presenting to the emergency department with complaint of 2 days of cough and hiccups.  Reports fever at home of 100 F, no interventions tried prior to arrival.  Treating his shortness of breath at home with albuterol with improvement back to baseline.  Patient not short of breath in the ED.  Vital signs are normal, normal oxygenation.  Lungs with faint wheezes to the right lower lobe.  No respiratory distress.  Tolerating secretion  Patient with track marks consistent with current IV drug use, however patient denies current use when asked.  No obvious abscesses amenable to drainage.  Patient discussed with Dr. Venora Maples.  Given patient's persistent hiccups and symptoms, labs and imaging ordered with suspicion for possible pneumonia as cause of diaphragm irritation.  Blood cultures sent.  Symptoms treated with IV fluids, pain medications, muscle relaxer.   Labs without leukocytosis.  Chest x-ray is negative.  Given patient's history of IV drug use and unknown etiology of the hiccups, will proceed with CT scan for further evaluation.    CT scan revealing Multiple small opacities in the right lower and middle lobes compatible with infectious process.  Upon receiving CT results, RN reporting patient stating he is ready for discharge, reports his hiccups are gone and he feels much better.  Was able to discuss results with patient, and concern for possible  worsening infection.  Given patient's history of IV drug use and suspicion for current use, I cannot completely rule out endocarditis, however vital signs are normal and patient is afebrile with out leukocytosis.  Had discussion with patient regarding strict return precautions and importance to take antibiotics as prescribed until gone.  Patient verbalized understanding of return precautions and importance of antibiotics.  He is instructed to return the ED if his symptoms worsen or do not improve within 24 to 48 hours.  Patient refused repeat vital signs upon discharge.   Final Clinical Impressions(s) / ED Diagnoses   Final diagnoses:  Community acquired pneumonia of right lung, unspecified part of lung    ED Discharge Orders         Ordered    doxycycline (VIBRAMYCIN) 100 MG capsule  2 times daily     12/26/18 1657           Robinson, [place] N, PA-C 12/26/18 2144    Jola Schmidt, MD 12/27/18 [phone]

## 2018-12-26 NOTE — Discharge Instructions (Addendum)
It is important that you take the antibiotics as prescribed until gone. Use your albuterol every 4 hours as needed. You can take tylenol and alternate with ibuprofen for headache and fever. Monitor your symptoms.  It is strongly recommend you report back to the emergency department if you develop worsening symptoms or no improvement after 24 to 48 hours of antibiotic treatment.  Follow up with primary care to discuss incidental CT findings of a dilated pancreatic duct.

## 2018-12-26 NOTE — ED Triage Notes (Signed)
Pt reports fatigue, cough, hiccups, fever, chills since yesterday

## 2018-12-27 ENCOUNTER — Telehealth (HOSPITAL_BASED_OUTPATIENT_CLINIC_OR_DEPARTMENT_OTHER): Payer: Self-pay | Admitting: Emergency Medicine

## 2018-12-27 LAB — BLOOD CULTURE ID PANEL (REFLEXED)
Acinetobacter baumannii: NOT DETECTED
CANDIDA GLABRATA: NOT DETECTED
CANDIDA PARAPSILOSIS: NOT DETECTED
CANDIDA TROPICALIS: NOT DETECTED
Candida albicans: NOT DETECTED
Candida krusei: NOT DETECTED
ENTEROBACTER CLOACAE COMPLEX: NOT DETECTED
Enterobacteriaceae species: NOT DETECTED
Enterococcus species: NOT DETECTED
Escherichia coli: NOT DETECTED
Haemophilus influenzae: NOT DETECTED
KLEBSIELLA PNEUMONIAE: NOT DETECTED
Klebsiella oxytoca: NOT DETECTED
Listeria monocytogenes: NOT DETECTED
Methicillin resistance: DETECTED — AB
NEISSERIA MENINGITIDIS: NOT DETECTED
PROTEUS SPECIES: NOT DETECTED
Pseudomonas aeruginosa: NOT DETECTED
STREPTOCOCCUS SPECIES: NOT DETECTED
Serratia marcescens: NOT DETECTED
Staphylococcus aureus (BCID): NOT DETECTED
Staphylococcus species: DETECTED — AB
Streptococcus agalactiae: NOT DETECTED
Streptococcus pneumoniae: NOT DETECTED
Streptococcus pyogenes: NOT DETECTED

## 2018-12-29 LAB — CULTURE, BLOOD (ROUTINE X 2): Special Requests: ADEQUATE

## 2018-12-30 ENCOUNTER — Emergency Department (HOSPITAL_COMMUNITY): Payer: No Typology Code available for payment source

## 2018-12-30 ENCOUNTER — Emergency Department (HOSPITAL_COMMUNITY)
Admission: EM | Admit: 2018-12-30 | Discharge: 2018-12-30 | Disposition: A | Discharge status: Home / Self Care | Payer: No Typology Code available for payment source | Attending: Emergency Medicine | Admitting: Emergency Medicine

## 2018-12-30 ENCOUNTER — Other Ambulatory Visit: Payer: Self-pay

## 2018-12-30 ENCOUNTER — Telehealth (HOSPITAL_BASED_OUTPATIENT_CLINIC_OR_DEPARTMENT_OTHER): Payer: Self-pay | Admitting: *Deleted

## 2018-12-30 ENCOUNTER — Encounter (HOSPITAL_COMMUNITY): Payer: Self-pay

## 2018-12-30 DIAGNOSIS — F17210 Nicotine dependence, cigarettes, uncomplicated: Secondary | ICD-10-CM | POA: Diagnosis not present

## 2018-12-30 DIAGNOSIS — R05 Cough: Secondary | ICD-10-CM | POA: Diagnosis present

## 2018-12-30 DIAGNOSIS — J4541 Moderate persistent asthma with (acute) exacerbation: Secondary | ICD-10-CM | POA: Insufficient documentation

## 2018-12-30 DIAGNOSIS — Z79899 Other long term (current) drug therapy: Secondary | ICD-10-CM | POA: Diagnosis not present

## 2018-12-30 DIAGNOSIS — J189 Pneumonia, unspecified organism: Secondary | ICD-10-CM | POA: Diagnosis not present

## 2018-12-30 LAB — DIFFERENTIAL
Abs Immature Granulocytes: 0.01 10*3/uL (ref 0.00–0.07)
BASOS ABS: 0 10*3/uL (ref 0.0–0.1)
BASOS PCT: 0 %
EOS PCT: 0 %
Eosinophils Absolute: 0 10*3/uL (ref 0.0–0.5)
Immature Granulocytes: 0 %
Lymphocytes Relative: 27 %
Lymphs Abs: 1.3 10*3/uL (ref 0.7–4.0)
MONO ABS: 0.3 10*3/uL (ref 0.1–1.0)
Monocytes Relative: 6 %
NEUTROS PCT: 67 %
Neutro Abs: 3.2 10*3/uL (ref 1.7–7.7)

## 2018-12-30 LAB — CBC
HCT: 35.6 % — ABNORMAL LOW (ref 39.0–52.0)
Hemoglobin: 11.1 g/dL — ABNORMAL LOW (ref 13.0–17.0)
MCH: 27.1 pg (ref 26.0–34.0)
MCHC: 31.2 g/dL (ref 30.0–36.0)
MCV: 87 fL (ref 80.0–100.0)
Platelets: 131 10*3/uL — ABNORMAL LOW (ref 150–400)
RBC: 4.09 MIL/uL — ABNORMAL LOW (ref 4.22–5.81)
RDW: 12.7 % (ref 11.5–15.5)
WBC: 4.8 10*3/uL (ref 4.0–10.5)
nRBC: 0 % (ref 0.0–0.2)

## 2018-12-30 LAB — BASIC METABOLIC PANEL
Anion gap: 9 (ref 5–15)
BUN: 7 mg/dL (ref 6–20)
CALCIUM: 8.9 mg/dL (ref 8.9–10.3)
CO2: 25 mmol/L (ref 22–32)
Chloride: 101 mmol/L (ref 98–111)
Creatinine, Ser: 0.89 mg/dL (ref 0.61–1.24)
GFR calc non Af Amer: >60 mL/min (ref >60)
Glucose, Bld: 104 mg/dL — ABNORMAL HIGH (ref 70–99)
Potassium: 4 mmol/L (ref 3.5–5.1)
SODIUM: 135 mmol/L (ref 135–145)

## 2018-12-30 LAB — LACTIC ACID, PLASMA: LACTIC ACID, VENOUS: 1 mmol/L (ref 0.5–1.9)

## 2018-12-30 MED ORDER — AMOXICILLIN-POT CLAVULANATE 875-125 MG PO TABS: 1.0000 | ORAL_TABLET | Freq: Two times a day (BID) | ORAL | 0 refills | Status: DC

## 2018-12-30 MED ORDER — ACETAMINOPHEN 325 MG PO TABS: 650.0000 mg | ORAL_TABLET | Freq: Once | ORAL | Status: DC

## 2018-12-30 MED ORDER — ALBUTEROL SULFATE (2.5 MG/3ML) 0.083% IN NEBU: 2.5000 mg | INHALATION_SOLUTION | Freq: Four times a day (QID) | RESPIRATORY_TRACT | 12 refills | Status: DC | PRN

## 2018-12-30 MED ORDER — IPRATROPIUM BROMIDE 0.02 % IN SOLN: 0.5000 mg | Freq: Four times a day (QID) | RESPIRATORY_TRACT | 12 refills | Status: DC

## 2018-12-30 MED ORDER — IBUPROFEN 800 MG PO TABS
800.0000 mg | ORAL_TABLET | Freq: Once | ORAL | Status: AC
  Administered 2018-12-30: 800 mg via ORAL
  Filled 2018-12-30: qty 1

## 2018-12-30 MED ORDER — ALBUTEROL SULFATE (2.5 MG/3ML) 0.083% IN NEBU
2.5000 mg | INHALATION_SOLUTION | Freq: Once | RESPIRATORY_TRACT | Status: AC
  Administered 2018-12-30: 2.5 mg via RESPIRATORY_TRACT
  Filled 2018-12-30: qty 3

## 2018-12-30 MED ORDER — IPRATROPIUM-ALBUTEROL 0.5-2.5 (3) MG/3ML IN SOLN
3.0000 mL | Freq: Once | RESPIRATORY_TRACT | Status: AC
  Administered 2018-12-30: 3 mL via RESPIRATORY_TRACT
  Filled 2018-12-30: qty 3

## 2018-12-30 MED ORDER — METHYLPREDNISOLONE SODIUM SUCC 125 MG IJ SOLR
80.0000 mg | Freq: Once | INTRAMUSCULAR | Status: AC
  Administered 2018-12-30: 80 mg via INTRAVENOUS
  Filled 2018-12-30: qty 2

## 2018-12-30 MED ORDER — PREDNISONE 20 MG PO TABS: 60.0000 mg | ORAL_TABLET | Freq: Every day | ORAL | 0 refills | Status: DC

## 2018-12-30 MED ORDER — SODIUM CHLORIDE 0.9 % IV BOLUS
1000.0000 mL | Freq: Once | INTRAVENOUS | Status: AC
  Administered 2018-12-30: 1000 mL via INTRAVENOUS

## 2018-12-30 NOTE — ED Notes (Signed)
Pt ambulated 60 feet without any assistance and a steady a gait. SpO2 hovered between 89-94%. Cristie Hem, PA aware

## 2018-12-30 NOTE — ED Provider Notes (Signed)
Signout from [employer], [place], at shift change See previous providers note for full H&P  Briefly, patient was diagnosed with pneumonia via chest CT on 12/26/2018.  He was started on doxycycline.  He has been using albuterol at home, which is expired, and has not been getting any better.  He has had associated shortness of breath.  Patient had one blood culture that came back with staph and the other 2 was negative.  Possible contaminant.  Repeat was done today.  Patient has history of IV drug use, however has not used in 3 months.  He is on methadone.  Plan at shift change was repeat chest x-ray and if patient is stable and feeling better, he can probably go home.  Patient is feeling much better after 2 DuoNeb's, Solu-Medrol, and fluids in the ED.  Patient has no leukocytosis.  Lactic acid is negative.  BMP is unremarkable.  Blood cultures are pending.  Chest x-ray shows persistent peribronchial thickening and patchy bilateral lower lobe opacities compatible with pneumonia.  Considering patient's improved symptomatology, will discharge home with prednisone, and Augmentin if symptoms are not improving.  Will give albuterol and ipratropium nebulizer solution for home as well.  Follow-up to PCP in 2 days.  Strict return precautions discussed.  Patient understands and agrees with plan.  Patient vitals stable throughout ED course and discharged in satisfactory condition. I discussed patient case with Dr. Dayna Barker who guided the patient's management and agrees with plan.    Frederica Kuster, PA-C 12/30/18 2213    Merrily Pew, MD 12/30/18 2226

## 2018-12-30 NOTE — ED Notes (Signed)
Pt requesting note for court tomorrow, because of his condition pt will be unable to attend his hearing.  Court is requesting note be faxed to them.  RN notified.

## 2018-12-30 NOTE — Discharge Instructions (Addendum)
Take prednisone until completed.  Use albuterol and ipratropium nebulizer solution every 6 hours as needed for wheezing and shortness of breath.  If your symptoms are not improving over the next 2 days, begin taking Augmentin as well.  Please return to emergency department if you develop any new or worsening symptoms including worsening shortness of breath, passing out, persistent fever 100.4, or any other concerning symptoms.  Please touch base with your primary care doctor in the next few days.

## 2018-12-30 NOTE — ED Notes (Signed)
Pt refused to have a rectal temperature done

## 2018-12-30 NOTE — ED Provider Notes (Signed)
[facility] [place] DEPT Provider Note   CSN: [id number] Arrival date & time: 12/30/18  1315    History   Chief Complaint Chief Complaint  Patient presents with  . Pneumonia  . Headache  . Abnormal Lab    HPI Sipriano Fendley is a 28 y.o. male.     HPI   28 year old male, with PMH of IV drug abuse, methadone dependence, presents with worsening cough, sputum production, headache.  Patient was seen and evaluated on 3/6 and diagnosed with pneumonia.  He was started on doxycycline and states his been compliant with medication.  Patient states however he called his Primary Care Doctor who noted his blood work came back with something concerning and told him to go to the ED for further evaluation.  Patient states fevers with a T-max of 100.1.  Patient states a headache which is gradually worsening over the last week.  He has been taking his nebulizer treatments with no improvement in his symptoms.  He denies any chest pain, nausea, vomiting, abdominal pain.  Past Medical History:  Diagnosis Date  . Asthma   . Methadone dependence ([place])   . Tendonitis     Patient Active Problem List   Diagnosis Date Noted  . Closed fracture of lateral portion of right tibial plateau 12/04/2016  . Cellulitis of hand 08/12/2012  . Drug addiction ([place]) 09/11/2011  . 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 Asthma 03/28/2010    Past Surgical History:  Procedure Laterality Date  . INCISION AND DRAINAGE OF WOUND  08/12/2012   Procedure: IRRIGATION AND DEBRIDEMENT WOUND;  Surgeon: Tennis Must, MD;  Location: [facility];  Service: Orthopedics;  Laterality: Right;  . WISDOM TOOTH EXTRACTION          Home Medications    Prior to Admission medications   Medication Sig Start Date End Date Taking? Authorizing Provider  albuterol (PROVENTIL HFA;VENTOLIN HFA) 108 (90 Base) MCG/ACT inhaler Inhale 2 puffs into the lungs every 4 (four) hours as needed for wheezing or shortness of  breath. 12/19/16 01/24/18  Clent Demark, PA-C  albuterol (PROVENTIL) (5 MG/ML) 0.5% nebulizer solution Take 0.5 mLs (2.5 mg total) by nebulization every 4 (four) hours as needed for wheezing or shortness of breath. Patient not taking: Reported on 01/24/2018 09/08/11   Samuella Cota, MD  doxycycline (VIBRAMYCIN) 100 MG capsule Take 1 capsule (100 mg total) by mouth 2 (two) times daily for 7 days. 12/26/18 01/02/19  Robinson, [place] N, PA-C  ibuprofen (ADVIL,MOTRIN) 200 MG tablet Take 200-400 mg by mouth every 6 (six) hours as needed for headache or mild pain.     [provider]  ibuprofen (ADVIL,MOTRIN) 600 MG tablet Take 1 tablet (600 mg total) by mouth every 8 (eight) hours as needed. Patient not taking: Reported on 01/24/2018 12/19/16   Clent Demark, PA-C  methadone (DOLOPHINE) 1 mg/ml oral solution Take by mouth daily. 80 mg a day    [provider]  naproxen sodium (ALEVE) 220 MG tablet Take 220-440 mg by mouth 2 (two) times daily as needed (for pain).     [provider]  sertraline (ZOLOFT) 50 MG tablet Take 50 mg by mouth daily.    [provider]    Family History History reviewed. No pertinent family history.  Social History Social History   Tobacco Use  . Smoking status: Current Every Day Smoker    Packs/day: 0.50    Years: 5.00    Pack years: 2.50  Types: Cigarettes  . Smokeless tobacco: Never Used  Substance Use Topics  . Alcohol use: Not Currently    Comment: last used on 08/10/2012  . Drug use: Not Currently    Types: Heroin, IV, Cocaine    Comment: on methadone program started ~ 03-2011       Allergies   Vicodin [hydrocodone-acetaminophen] and Xanax [alprazolam]   Review of Systems Review of Systems  Constitutional: Negative for chills and fever.  HENT: Negative for rhinorrhea and sore throat.   Eyes: Negative for visual disturbance.  Respiratory: Positive for cough and shortness of breath.   Cardiovascular:  Negative for chest pain.  Gastrointestinal: Negative for abdominal pain, diarrhea, nausea and vomiting.  Genitourinary: Negative for dysuria, frequency and urgency.  Musculoskeletal: Negative for joint swelling and neck pain.  Skin: Negative for rash and wound.  Neurological: Positive for headaches. Negative for syncope and numbness.  All other systems reviewed and are negative.    Physical Exam Updated Vital Signs BP 119/67   Pulse 83   Temp 98.4 F (36.9 C) (Oral)   Resp 15   Ht 6\' 3"  (1.905 m)   Wt 81.6 kg   SpO2 95%   BMI 22.50 kg/m   Physical Exam Vitals signs and nursing note reviewed.  Constitutional:      Appearance: He is well-developed.  HENT:     Head: Normocephalic and atraumatic.  Eyes:     Conjunctiva/sclera: Conjunctivae normal.  Neck:     Musculoskeletal: Neck supple.  Cardiovascular:     Rate and Rhythm: Normal rate and regular rhythm.     Heart sounds: Normal heart sounds. No murmur.  Pulmonary:     Effort: Pulmonary effort is normal. No respiratory distress.     Breath sounds: Wheezing (expiratory wheezing throughout) present. No rales.  Abdominal:     General: Bowel sounds are normal. There is no distension.     Palpations: Abdomen is soft.     Tenderness: There is no abdominal tenderness.  Musculoskeletal: Normal range of motion.        General: No tenderness or deformity.  Skin:    General: Skin is warm and dry.     Findings: No erythema or rash.  Neurological:     Mental Status: He is alert and oriented to person, place, and time.  Psychiatric:        Behavior: Behavior normal.      ED Treatments / Results  Labs (all labs ordered are listed, but only abnormal results are displayed) Labs Reviewed  CULTURE, BLOOD (ROUTINE X 2)  CULTURE, BLOOD (ROUTINE X 2)  CULTURE, BLOOD (SINGLE)  CBC WITH DIFFERENTIAL/PLATELET  BASIC METABOLIC PANEL  LACTIC ACID, PLASMA  LACTIC ACID, PLASMA    EKG None  Radiology No results  found.  Procedures Procedures (including critical care time)  Medications Ordered in ED Medications  sodium chloride 0.9 % bolus 1,000 mL (has no administration in time range)  albuterol (PROVENTIL) (2.5 MG/3ML) 0.083% nebulizer solution 2.5 mg (has no administration in time range)  ipratropium-albuterol (DUONEB) 0.5-2.5 (3) MG/3ML nebulizer solution 3 mL (has no administration in time range)  ibuprofen (ADVIL,MOTRIN) tablet 800 mg (has no administration in time range)     Initial Impression / Assessment and Plan / ED Course  I have reviewed the triage vital signs and the nursing notes.  Pertinent labs & imaging results that were available during my care of the patient were reviewed by me and considered in my medical decision making (  see chart for details).        Patient presented with a known pneumonia which she states is worsening.  Patient has no increased work of breathing or accessory muscle use.  He does have wheezing noted throughout.  His signs are stable.  Of note patient had a blood culture drawn on 3/6 which was positive for strep.  This is likely contaminant.  However will repeat blood cultures today.  We will add a third culture due to concern for possible endocarditis as noted by the provider on 3/6.  Blood work and chest x-ray pending.  Signout given to incoming PA, Armstead Peaks.  Pending on blood work, chest x-ray and patient improvement in the ED will determine disposition.   Final Clinical Impressions(s) / ED Diagnoses   Final diagnoses:  None    ED Discharge Orders    None       Rachel Moulds 12/30/18 1735    Lacretia Leigh, MD 01/01/19 1239

## 2018-12-30 NOTE — ED Notes (Signed)
Pt and family verbalized discharge instructions. Alert and ambulatory. No IV. Leaving with family.

## 2018-12-30 NOTE — ED Triage Notes (Addendum)
Patient was at [facility] on 12/26/18 and was diagnosed with pneumonia. Patient was told by Dr.Gomez that he had abnormal blood work and needed to come to the ED. Patient had positive blood cultures.  Patient also c/o headache x 1 week and states it has gotten progressively worse.

## 2018-12-31 LAB — CULTURE, BLOOD (ROUTINE X 2)
CULTURE: NO GROWTH
Special Requests: ADEQUATE

## 2019-01-04 LAB — CULTURE, BLOOD (SINGLE): Culture: NO GROWTH

## 2019-01-04 LAB — CULTURE, BLOOD (ROUTINE X 2)
Culture: NO GROWTH
Culture: NO GROWTH
SPECIAL REQUESTS: ADEQUATE

## 2019-06-21 ENCOUNTER — Inpatient Hospital Stay (HOSPITAL_COMMUNITY)
Admission: EM | Admit: 2019-06-21 | Discharge: 2019-06-23 | DRG: 202 | Disposition: A | Discharge status: Home / Self Care | Payer: No Typology Code available for payment source | Attending: Internal Medicine | Admitting: Internal Medicine

## 2019-06-21 ENCOUNTER — Emergency Department (HOSPITAL_COMMUNITY): Payer: Self-pay

## 2019-06-21 ENCOUNTER — Encounter (HOSPITAL_COMMUNITY): Payer: Self-pay

## 2019-06-21 DIAGNOSIS — F1120 Opioid dependence, uncomplicated: Secondary | ICD-10-CM | POA: Diagnosis present

## 2019-06-21 DIAGNOSIS — Z20828 Contact with and (suspected) exposure to other viral communicable diseases: Secondary | ICD-10-CM | POA: Diagnosis present

## 2019-06-21 DIAGNOSIS — J4541 Moderate persistent asthma with (acute) exacerbation: Secondary | ICD-10-CM

## 2019-06-21 DIAGNOSIS — J45901 Unspecified asthma with (acute) exacerbation: Secondary | ICD-10-CM | POA: Diagnosis present

## 2019-06-21 DIAGNOSIS — J9601 Acute respiratory failure with hypoxia: Secondary | ICD-10-CM | POA: Diagnosis present

## 2019-06-21 DIAGNOSIS — Z885 Allergy status to narcotic agent status: Secondary | ICD-10-CM

## 2019-06-21 DIAGNOSIS — F419 Anxiety disorder, unspecified: Secondary | ICD-10-CM | POA: Diagnosis present

## 2019-06-21 DIAGNOSIS — F1920 Other psychoactive substance dependence, uncomplicated: Secondary | ICD-10-CM

## 2019-06-21 DIAGNOSIS — Z79899 Other long term (current) drug therapy: Secondary | ICD-10-CM

## 2019-06-21 DIAGNOSIS — J45909 Unspecified asthma, uncomplicated: Secondary | ICD-10-CM

## 2019-06-21 DIAGNOSIS — Z888 Allergy status to other drugs, medicaments and biological substances status: Secondary | ICD-10-CM

## 2019-06-21 DIAGNOSIS — G8929 Other chronic pain: Secondary | ICD-10-CM | POA: Diagnosis present

## 2019-06-21 DIAGNOSIS — Z791 Long term (current) use of non-steroidal anti-inflammatories (NSAID): Secondary | ICD-10-CM

## 2019-06-21 DIAGNOSIS — F17210 Nicotine dependence, cigarettes, uncomplicated: Secondary | ICD-10-CM | POA: Diagnosis present

## 2019-06-21 DIAGNOSIS — R0603 Acute respiratory distress: Secondary | ICD-10-CM

## 2019-06-21 LAB — CBC WITH DIFFERENTIAL/PLATELET
Abs Immature Granulocytes: 0.03 10*3/uL (ref 0.00–0.07)
Basophils Absolute: 0 10*3/uL (ref 0.0–0.1)
Basophils Relative: 0 %
Eosinophils Absolute: 0.2 10*3/uL (ref 0.0–0.5)
Eosinophils Relative: 2 %
HCT: 47 % (ref 39.0–52.0)
Hemoglobin: 14.7 g/dL (ref 13.0–17.0)
Immature Granulocytes: 0 %
Lymphocytes Relative: 55 %
Lymphs Abs: 5.3 10*3/uL — ABNORMAL HIGH (ref 0.7–4.0)
MCH: 27.6 pg (ref 26.0–34.0)
MCHC: 31.3 g/dL (ref 30.0–36.0)
MCV: 88.3 fL (ref 80.0–100.0)
Monocytes Absolute: 0.5 10*3/uL (ref 0.1–1.0)
Monocytes Relative: 5 %
Neutro Abs: 3.7 10*3/uL (ref 1.7–7.7)
Neutrophils Relative %: 38 %
Platelets: 248 10*3/uL (ref 150–400)
RBC: 5.32 MIL/uL (ref 4.22–5.81)
RDW: 12.7 % (ref 11.5–15.5)
WBC: 9.7 10*3/uL (ref 4.0–10.5)
nRBC: 0 % (ref 0.0–0.2)

## 2019-06-21 LAB — COMPREHENSIVE METABOLIC PANEL
ALT: 23 U/L (ref 0–44)
AST: 25 U/L (ref 15–41)
Albumin: 4 g/dL (ref 3.5–5.0)
Alkaline Phosphatase: 121 U/L (ref 38–126)
Anion gap: 16 — ABNORMAL HIGH (ref 5–15)
BUN: 8 mg/dL (ref 6–20)
CO2: 20 mmol/L — ABNORMAL LOW (ref 22–32)
Calcium: 9.3 mg/dL (ref 8.9–10.3)
Chloride: 103 mmol/L (ref 98–111)
Creatinine, Ser: 0.87 mg/dL (ref 0.61–1.24)
GFR calc Af Amer: >60 mL/min (ref >60)
GFR calc non Af Amer: >60 mL/min (ref >60)
Glucose, Bld: 159 mg/dL — ABNORMAL HIGH (ref 70–99)
Potassium: 3.7 mmol/L (ref 3.5–5.1)
Sodium: 139 mmol/L (ref 135–145)
Total Bilirubin: 0.4 mg/dL (ref 0.3–1.2)
Total Protein: 8.8 g/dL — ABNORMAL HIGH (ref 6.5–8.1)

## 2019-06-21 LAB — ETHANOL: Alcohol, Ethyl (B): <10 mg/dL (ref <10)

## 2019-06-21 LAB — SARS CORONAVIRUS 2 BY RT PCR (HOSPITAL ORDER, PERFORMED IN ~~LOC~~ HOSPITAL LAB): SARS Coronavirus 2: NEGATIVE

## 2019-06-21 MED ORDER — ALBUTEROL SULFATE HFA 108 (90 BASE) MCG/ACT IN AERS
INHALATION_SPRAY | RESPIRATORY_TRACT | Status: AC
  Filled 2019-06-21: qty 6.7

## 2019-06-21 MED ORDER — ALBUTEROL SULFATE (2.5 MG/3ML) 0.083% IN NEBU
2.5000 mg | INHALATION_SOLUTION | Freq: Four times a day (QID) | RESPIRATORY_TRACT | Status: DC | PRN
  Administered 2019-06-22: 2.5 mg via RESPIRATORY_TRACT
  Filled 2019-06-21: qty 3

## 2019-06-21 MED ORDER — ALBUTEROL SULFATE HFA 108 (90 BASE) MCG/ACT IN AERS: 2.0000 | INHALATION_SPRAY | RESPIRATORY_TRACT | Status: DC | PRN

## 2019-06-21 MED ORDER — NICOTINE 21 MG/24HR TD PT24
21.0000 mg | MEDICATED_PATCH | Freq: Every day | TRANSDERMAL | Status: DC
  Administered 2019-06-22 – 2019-06-23 (×2): 21 mg via TRANSDERMAL
  Filled 2019-06-21: qty 1

## 2019-06-21 MED ORDER — ONDANSETRON HCL 4 MG/2ML IJ SOLN: 4.0000 mg | Freq: Four times a day (QID) | INTRAMUSCULAR | Status: DC | PRN

## 2019-06-21 MED ORDER — METHADONE HCL 10 MG/ML PO CONC
120.0000 mg | Freq: Every day | ORAL | Status: DC
  Administered 2019-06-22 – 2019-06-23 (×2): 120 mg via ORAL
  Filled 2019-06-21 (×3): qty 12

## 2019-06-21 MED ORDER — IBUPROFEN 800 MG PO TABS: 800.0000 mg | ORAL_TABLET | Freq: Every day | ORAL | Status: DC | PRN

## 2019-06-21 MED ORDER — ONDANSETRON HCL 4 MG PO TABS: 4.0000 mg | ORAL_TABLET | Freq: Four times a day (QID) | ORAL | Status: DC | PRN

## 2019-06-21 MED ORDER — ALBUTEROL SULFATE HFA 108 (90 BASE) MCG/ACT IN AERS
8.0000 | INHALATION_SPRAY | RESPIRATORY_TRACT | Status: DC | PRN
  Filled 2019-06-21: qty 6.7

## 2019-06-21 MED ORDER — DEXAMETHASONE SODIUM PHOSPHATE 10 MG/ML IJ SOLN
10.0000 mg | Freq: Once | INTRAMUSCULAR | Status: AC
  Administered 2019-06-21: 17:00:00 10 mg via INTRAVENOUS
  Filled 2019-06-21: qty 1

## 2019-06-21 MED ORDER — ALBUTEROL SULFATE HFA 108 (90 BASE) MCG/ACT IN AERS
8.0000 | INHALATION_SPRAY | RESPIRATORY_TRACT | Status: DC
  Administered 2019-06-21: 8 via RESPIRATORY_TRACT
  Filled 2019-06-21: qty 6.7

## 2019-06-21 MED ORDER — IPRATROPIUM BROMIDE 0.02 % IN SOLN
0.5000 mg | Freq: Four times a day (QID) | RESPIRATORY_TRACT | Status: DC
  Administered 2019-06-22: 0.5 mg via RESPIRATORY_TRACT
  Filled 2019-06-21: qty 2.5

## 2019-06-21 MED ORDER — METHYLPREDNISOLONE SODIUM SUCC 40 MG IJ SOLR
40.0000 mg | Freq: Four times a day (QID) | INTRAMUSCULAR | Status: DC
  Administered 2019-06-21 – 2019-06-23 (×6): 40 mg via INTRAVENOUS
  Filled 2019-06-21 (×6): qty 1

## 2019-06-21 MED ORDER — SODIUM CHLORIDE 0.9 % IV SOLN
INTRAVENOUS | Status: DC
  Administered 2019-06-21: via INTRAVENOUS

## 2019-06-21 MED ORDER — ENOXAPARIN SODIUM 40 MG/0.4ML ~~LOC~~ SOLN
40.0000 mg | Freq: Every day | SUBCUTANEOUS | Status: DC
  Administered 2019-06-21: 40 mg via SUBCUTANEOUS
  Filled 2019-06-21 (×2): qty 0.4

## 2019-06-21 MED ORDER — SERTRALINE HCL 50 MG PO TABS
50.0000 mg | ORAL_TABLET | Freq: Every day | ORAL | Status: DC
  Administered 2019-06-22 – 2019-06-23 (×2): 50 mg via ORAL
  Filled 2019-06-21 (×2): qty 1

## 2019-06-21 NOTE — ED Triage Notes (Signed)
Patient arrived via [facility] from home. Patient is AOx4 and ambulatory at baseline. Patient last used IV cocaine 2 days ago per patient. Patient presents in severe respiratory distress, diaphoretic, unable to speak in full / partial sentences. EMS gave EPI and Neb treatment in route. ED Provider made aware. Respiratory called.

## 2019-06-21 NOTE — ED Provider Notes (Signed)
[name] Provider Note   CSN: [id number] Arrival date & time: 06/21/19  1700     History   Chief Complaint Chief Complaint  Patient presents with  . Respiratory Distress    HPI [name] is a 28 y.o. male.     HPI Patient presents via EMS in respiratory distress. Patient is speaking with great difficulty, but provide some details of his history, however, most details are obtained via EMS. Reportedly the patient has had difficulty breathing for 2 days, with increasing severity. He has a known asthmatic, smokes cigarettes as well. EMS reports that on their arrival patient was diaphoretic, agonal, altered. He received epi, IM, nebulized medication, has had substantial improvement in route, but continues to have tachypnea, increased work of breathing. Level 5 caveat secondary to acuity of condition. Past Medical History:  Diagnosis Date  . Asthma   . Methadone dependence ([place])   . Tendonitis     Patient Active Problem List   Diagnosis Date Noted  . Closed fracture of lateral portion of right tibial plateau 12/04/2016  . Cellulitis of hand 08/12/2012  . Drug addiction ([place]) 09/11/2011  . 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 Asthma 03/28/2010    Past Surgical History:  Procedure Laterality Date  . INCISION AND DRAINAGE OF WOUND  08/12/2012   Procedure: IRRIGATION AND DEBRIDEMENT WOUND;  Surgeon: Tennis Must, MD;  Location: [facility];  Service: Orthopedics;  Laterality: Right;  . WISDOM TOOTH EXTRACTION          Home Medications    Prior to Admission medications   Medication Sig Start Date End Date Taking? Authorizing Provider  albuterol (PROVENTIL HFA;VENTOLIN HFA) 108 (90 Base) MCG/ACT inhaler Inhale 2 puffs into the lungs every 4 (four) hours as needed for wheezing or shortness of breath. 12/19/16 12/30/18  Clent Demark, PA-C  albuterol (PROVENTIL) (2.5 MG/3ML) 0.083% nebulizer solution Take 3 mLs (2.5 mg  total) by nebulization every 6 (six) hours as needed for wheezing or shortness of breath. 12/30/18   Law, Bea Graff, PA-C  amoxicillin-clavulanate (AUGMENTIN) 875-125 MG tablet Take 1 tablet by mouth every 12 (twelve) hours. Patient not taking: Reported on 06/21/2019 12/30/18   Frederica Kuster, PA-C  ibuprofen (ADVIL,MOTRIN) 200 MG tablet Take 800 mg by mouth daily as needed for headache or mild pain.     [provider]  ibuprofen (ADVIL,MOTRIN) 600 MG tablet Take 1 tablet (600 mg total) by mouth every 8 (eight) hours as needed. Patient not taking: Reported on 12/30/2018 12/19/16   Clent Demark, PA-C  ipratropium (ATROVENT) 0.02 % nebulizer solution Take 2.5 mLs (0.5 mg total) by nebulization 4 (four) times daily. 12/30/18   Law, Bea Graff, PA-C  methadone (DOLOPHINE) 1 mg/ml oral solution Take 90 mg/kg by mouth daily. 80 mg a day     [provider]  predniSONE (DELTASONE) 20 MG tablet Take 3 tablets (60 mg total) by mouth daily. 12/30/18   Law, Bea Graff, PA-C  sertraline (ZOLOFT) 50 MG tablet Take 50 mg by mouth daily.    [provider]    Family History History reviewed. No pertinent family history.  Social History Social History   Tobacco Use  . Smoking status: Current Every Day Smoker    Packs/day: 0.50    Years: 5.00    Pack years: 2.50    Types: Cigarettes  . Smokeless tobacco: Never Used  Substance Use Topics  . Alcohol use: Not Currently  Comment: last used on 08/10/2012  . Drug use: Not Currently    Types: Heroin, IV, Cocaine    Comment: on methadone program started ~ 03-2011       Allergies   Vicodin [hydrocodone-acetaminophen] and Xanax [alprazolam]   Review of Systems Review of Systems  Unable to perform ROS: Acuity of condition   After the patient has improved substantially, on subsequent review of system he states that he is on methadone  Physical Exam Updated Vital Signs BP 118/66   Pulse 82   Resp 15   Ht (S) 6\' 3"   (1.905 m)   Wt (S) 80 kg   SpO2 98%   BMI 22.04 kg/m   Physical Exam Vitals signs and nursing note reviewed.  Constitutional:      General: He is in acute distress.     Appearance: He is well-developed. He is ill-appearing and diaphoretic.  HENT:     Head: Normocephalic and atraumatic.  Eyes:     Conjunctiva/sclera: Conjunctivae normal.  Cardiovascular:     Rate and Rhythm: Regular rhythm. Tachycardia present.  Pulmonary:     Effort: Respiratory distress present.     Breath sounds: No stridor. Wheezing present.  Abdominal:     General: There is no distension.  Skin:    General: Skin is warm.  Neurological:     Mental Status: He is alert.     Comments: Moves all extremities spontaneously, but is minimally capable of participating in the initial evaluation.  Psychiatric:        Behavior: Behavior is withdrawn.      ED Treatments / Results  Labs (all labs ordered are listed, but only abnormal results are displayed) Labs Reviewed  COMPREHENSIVE METABOLIC PANEL - Abnormal; Notable for the following components:      Result Value   CO2 20 (*)    Glucose, Bld 159 (*)    Total Protein 8.8 (*)    Anion gap 16 (*)    All other components within normal limits  CBC WITH DIFFERENTIAL/PLATELET - Abnormal; Notable for the following components:   Lymphs Abs 5.3 (*)    All other components within normal limits  SARS CORONAVIRUS 2 (HOSPITAL ORDER, [facility])  ETHANOL    EKG EKG Interpretation  Date/Time:  Sunday June 21 2019 17:15:30 EDT Ventricular Rate:  98 PR Interval:    QRS Duration: 98 QT Interval:  363 QTC Calculation: 464 R Axis:   95 Text Interpretation:  Sinus rhythm Borderline right axis deviation RSR' in V1 or V2, probably normal variant Artifact Abnormal ECG Confirmed by Carmin Muskrat [phone]) on 06/21/2019 5:23:31 PM   Radiology No results found.  Procedures Procedures (including critical care time)  CRITICAL CARE  Performed by: Carmin Muskrat Total critical care time: 35 minutes Critical care time was exclusive of separately billable procedures and treating other patients. Critical care was necessary to treat or prevent imminent or life-threatening deterioration. Critical care was time spent personally by me on the following activities: development of treatment plan with patient and/or surrogate as well as nursing, discussions with consultants, evaluation of patient's response to treatment, examination of patient, obtaining history from patient or surrogate, ordering and performing treatments and interventions, ordering and review of laboratory studies, ordering and review of radiographic studies, pulse oximetry and re-evaluation of patient's condition.   Medications Ordered in ED Medications  albuterol (VENTOLIN HFA) 108 (90 Base) MCG/ACT inhaler (  Not Given 06/21/19 1722)  albuterol (VENTOLIN HFA) 108 (  90 Base) MCG/ACT inhaler 8 puff (has no administration in time range)  dexamethasone (DECADRON) injection 10 mg (10 mg Intravenous Given 06/21/19 1727)     Initial Impression / Assessment and Plan / ED Course  I have reviewed the triage vital signs and the nursing notes.  Pertinent labs & imaging results that were available during my care of the patient were reviewed by me and considered in my medical decision making (see chart for details).    Concern for respiratory distress the patient continue to receive supplemental oxygen, will receive nebs, steroids.    8:43 PM Patient much more comfortable than on arrival, though he remains dyspneic, diaphoretic, with audible wheezing. However, he is only requiring 2 L via nasal cannula for appropriate saturation, has no visible accessory muscle use. He is received Decadron, epinephrine, multiple breathing treatments. X-rays not demonstrate pneumonia, coronavirus test is pending Given the patient's findings concerning for acute asthma exacerbation, with  requirement for ongoing nebulizer therapy, the patient will be admitted for further monitoring, management.  COVID negative Final Clinical Impressions(s) / ED Diagnoses   Final diagnoses:  Respiratory distress     Carmin Muskrat, MD 06/21/19 2229

## 2019-06-21 NOTE — ED Triage Notes (Signed)
Patient arrived via [facility] from home.   C/O shob.    EMS states when fire department arrived patient was 35% on RA.  Patient placed on Non rebreather at 99%.    A/Ox4   Hx. Asthma

## 2019-06-21 NOTE — H&P (Signed)
History and Physical   Ein Budz [id number] DOB: 28-Aug-1991 DOA: 06/21/2019  Referring MD/NP/PA: Dr. Vanita Panda  PCP: Patient, No Pcp Per   Outpatient Specialists: None  Patient coming from: Home  Chief Complaint: Shortness of  HPI: [name] is a 28 y.o. male with medical history significant of asthma, chronic pain on methadone who presents to the ER with progressive shortness of breath and cough for 3 days.  He took his breathing treatments at home including nebulizer with no significant help.  He continues to have significant shortness of breath cough and wheezing.  Patient came to the ER where he was seen and evaluated.  He was continuously wheezing.  He was given inhalers in the ER.  COVID-19 was checked which came back negative and patient received nebulizer treatment.  Also steroids.  He was still having significant shortness of breath and wheeze.  Patient is therefore being admitted to the hospital for inpatient care having failed outpatient therapy.,.  ED Course: Temperature is 97.6 blood pressure 129/98 pulse 103 respirate 22 oxygen sat 95% on 2 L.  CBC is entirely normal chemistry normal except for CO2 of 20.  Chest x-ray showed no acute findings.  EKG shows sinus tachycardia.  Alcohol level is less than 10.  COVID-19 screen is negative.  Review of Systems: As per HPI otherwise 10 point review of systems negative.    Past Medical History:  Diagnosis Date  . Asthma   . 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 Tendonitis     Past Surgical History:  Procedure Laterality Date  . INCISION AND DRAINAGE OF WOUND  08/12/2012   Procedure: IRRIGATION AND DEBRIDEMENT WOUND;  Surgeon: Tennis Must, MD;  Location: [facility];  Service: Orthopedics;  Laterality: Right;  . WISDOM TOOTH EXTRACTION       reports that he has been smoking cigarettes. He has a 2.50 pack-year smoking history. He has never used smokeless tobacco. He reports previous alcohol use. He  reports previous drug use. Drugs: Heroin, IV, and Cocaine.  Allergies  Allergen Reactions  . Vicodin [Hydrocodone-Acetaminophen] Other (See Comments)    AVOID PAIN KILLERS OR ANY OTHER MEDS THAT MAY CAUSE ADDICTION  . Xanax [Alprazolam] Other (See Comments)    AVOID BENZOS OR ANY OTHER MEDS THAT MAY CAUSE ADDICTION    History reviewed. No pertinent family history.   Prior to Admission medications   Medication Sig Start Date End Date Taking? Authorizing Provider  albuterol (PROVENTIL HFA;VENTOLIN HFA) 108 (90 Base) MCG/ACT inhaler Inhale 2 puffs into the lungs every 4 (four) hours as needed for wheezing or shortness of breath. 12/19/16 06/21/19 Yes Clent Demark, PA-C  albuterol (PROVENTIL) (2.5 MG/3ML) 0.083% nebulizer solution Take 3 mLs (2.5 mg total) by nebulization every 6 (six) hours as needed for wheezing or shortness of breath. 12/30/18  Yes Law, Bea Graff, PA-C  ibuprofen (ADVIL,MOTRIN) 200 MG tablet Take 800 mg by mouth daily as needed for headache or mild pain.    Yes [provider]  ipratropium (ATROVENT) 0.02 % nebulizer solution Take 2.5 mLs (0.5 mg total) by nebulization 4 (four) times daily. 12/30/18  Yes Law, [place] M, PA-C  methadone (DOLOPHINE) 1 mg/ml oral solution Take 120 mg/kg by mouth daily.    Yes [provider]  sertraline (ZOLOFT) 50 MG tablet Take 50 mg by mouth daily.   Yes [provider]  amoxicillin-clavulanate (AUGMENTIN) 875-125 MG tablet Take 1 tablet by mouth every 12 (twelve) hours. Patient not taking: Reported  on 06/21/2019 12/30/18   Frederica Kuster, PA-C  ibuprofen (ADVIL,MOTRIN) 600 MG tablet Take 1 tablet (600 mg total) by mouth every 8 (eight) hours as needed. Patient not taking: Reported on 12/30/2018 12/19/16   Clent Demark, PA-C  predniSONE (DELTASONE) 20 MG tablet Take 3 tablets (60 mg total) by mouth daily. Patient not taking: Reported on 06/21/2019 12/30/18   Frederica Kuster, PA-C    Physical Exam:  Vitals:   06/21/19 2239 06/21/19 2245 06/21/19 2316 06/22/19 0115  BP: 114/64 115/67 (!) 110/55   Pulse: 68 71 71   Resp: 17 16 20    Temp:   97.6 F (36.4 C)   TempSrc:   Oral   SpO2: 95% 96% 100% 98%  Weight:      Height:          Constitutional: NAD, calm, comfortable Vitals:   06/21/19 2239 06/21/19 2245 06/21/19 2316 06/22/19 0115  BP: 114/64 115/67 (!) 110/55   Pulse: 68 71 71   Resp: 17 16 20    Temp:   97.6 F (36.4 C)   TempSrc:   Oral   SpO2: 95% 96% 100% 98%  Weight:      Height:       Eyes: PERRL, lids and conjunctivae normal ENMT: Mucous membranes are moist. Posterior pharynx clear of any exudate or lesions.Normal dentition.  Neck: normal, supple, no masses, no thyromegaly Respiratory: Poor air entry bilaterally with moderate expiratory wheezing, no rhonchi. Normal respiratory effort. No accessory muscle use.  Cardiovascular: Regular rate and rhythm, no murmurs / rubs / gallops. No extremity edema. 2+ pedal pulses. No carotid bruits.  Abdomen: no tenderness, no masses palpated. No hepatosplenomegaly. Bowel sounds positive.  Musculoskeletal: no clubbing / cyanosis. No joint deformity upper and lower extremities. Good ROM, no contractures. Normal muscle tone.  Skin: no rashes, lesions, ulcers. No induration Neurologic: CN 2-12 grossly intact. Sensation intact, DTR normal. Strength 5/5 in all 4.  Psychiatric: Normal judgment and insight. Alert and oriented x 3. Normal mood.     Labs on Admission: I have personally reviewed following labs and imaging studies  CBC: Recent Labs  Lab 06/21/19 1712  WBC 9.7  NEUTROABS 3.7  HGB 14.7  HCT 47.0  MCV 88.3  PLT [id number]   Basic Metabolic Panel: Recent Labs  Lab 06/21/19 1712  NA 139  K 3.7  CL 103  CO2 20*  GLUCOSE 159*  BUN 8  CREATININE 0.87  CALCIUM 9.3   GFR: Estimated Creatinine Clearance: 140.5 mL/min (by C-G formula based on SCr of 0.87 mg/dL). Liver Function Tests: Recent Labs  Lab 06/21/19  1712  AST 25  ALT 23  ALKPHOS 121  BILITOT 0.4  PROT 8.8*  ALBUMIN 4.0   No results for input(s): LIPASE, AMYLASE in the last 168 hours. No results for input(s): AMMONIA in the last 168 hours. Coagulation Profile: No results for input(s): INR, PROTIME in the last 168 hours. Cardiac Enzymes: No results for input(s): CKTOTAL, CKMB, CKMBINDEX, TROPONINI in the last 168 hours. BNP (last 3 results) No results for input(s): PROBNP in the last 8760 hours. HbA1C: No results for input(s): HGBA1C in the last 72 hours. CBG: No results for input(s): GLUCAP in the last 168 hours. Lipid Profile: No results for input(s): CHOL, HDL, LDLCALC, TRIG, CHOLHDL, LDLDIRECT in the last 72 hours. Thyroid Function Tests: No results for input(s): TSH, T4TOTAL, FREET4, T3FREE, THYROIDAB in the last 72 hours. Anemia Panel: No results for input(s): VITAMINB12, FOLATE, FERRITIN, TIBC,  IRON, RETICCTPCT in the last 72 hours. Urine analysis: No results found for: COLORURINE, APPEARANCEUR, LABSPEC, PHURINE, GLUCOSEU, HGBUR, BILIRUBINUR, KETONESUR, PROTEINUR, UROBILINOGEN, NITRITE, LEUKOCYTESUR Sepsis Labs: @LABRCNTIP (procalcitonin:4,lacticidven:4) ) Recent Results (from the past 240 hour(s))  SARS Coronavirus 2 [facility] order, Performed in [facility] hospital lab) Nasopharyngeal Nasopharyngeal Swab     Status: None   Collection Time: 06/21/19  5:13 PM   Specimen: Nasopharyngeal Swab  Result Value Ref Range Status   SARS Coronavirus 2 NEGATIVE NEGATIVE Final    Comment: (NOTE) If result is NEGATIVE SARS-CoV-2 target nucleic acids are NOT DETECTED. The SARS-CoV-2 RNA is generally detectable in upper and lower  respiratory specimens during the acute phase of infection. The lowest  concentration of SARS-CoV-2 viral copies this assay can detect is 250  copies / mL. A negative result does not preclude SARS-CoV-2 infection  and should not be used as the sole basis for treatment or other  patient management  decisions.  A negative result may occur with  improper specimen collection / handling, submission of specimen other  than nasopharyngeal swab, presence of viral mutation(s) within the  areas targeted by this assay, and inadequate number of viral copies  (<250 copies / mL). A negative result must be combined with clinical  observations, patient history, and epidemiological information. If result is POSITIVE SARS-CoV-2 target nucleic acids are DETECTED. The SARS-CoV-2 RNA is generally detectable in upper and lower  respiratory specimens dur ing the acute phase of infection.  Positive  results are indicative of active infection with SARS-CoV-2.  Clinical  correlation with patient history and other diagnostic information is  necessary to determine patient infection status.  Positive results do  not rule out bacterial infection or co-infection with other viruses. If result is PRESUMPTIVE POSTIVE SARS-CoV-2 nucleic acids MAY BE PRESENT.   A presumptive positive result was obtained on the submitted specimen  and confirmed on repeat testing.  While 2019 novel coronavirus  (SARS-CoV-2) nucleic acids may be present in the submitted sample  additional confirmatory testing may be necessary for epidemiological  and / or clinical management purposes  to differentiate between  SARS-CoV-2 and other Sarbecovirus currently known to infect humans.  If clinically indicated additional testing with an alternate test  methodology [phone]) is advised. The SARS-CoV-2 RNA is generally  detectable in upper and lower respiratory sp ecimens during the acute  phase of infection. The expected result is Negative. Fact Sheet for Patients:  StrictlyIdeas.no Fact Sheet for Healthcare Providers: BankingDealers.co.za This test is not yet approved or cleared by the [place] FDA and has been authorized for detection and/or diagnosis of SARS-CoV-2 by FDA under an  Emergency Use Authorization (EUA).  This EUA will remain in effect (meaning this test can be used) for the duration of the COVID-19 declaration under Section 564(b)(1) of the Act, 21 U.S.C. section 360bbb-3(b)(1), unless the authorization is terminated or revoked sooner. Performed at [facility], [place] [address], [place], [place] [address]      Radiological Exams on Admission: Dg Chest Port 1 View  Result Date: 06/21/2019 CLINICAL DATA:  Initial evaluation for acute respiratory distress, cough. EXAM: PORTABLE CHEST 1 VIEW COMPARISON:  Prior radiograph from 12/30/2018. FINDINGS: The cardiac and mediastinal silhouettes are stable in size and contour, and remain within normal limits. The lungs are normally inflated. No airspace consolidation, pleural effusion, or pulmonary edema is identified. There is no pneumothorax. No acute osseous abnormality identified. IMPRESSION: No radiographic evidence for active cardiopulmonary disease. Electronically Signed  By: Jeannine Boga M.D.   On: 06/21/2019 21:19    EKG: Independently reviewed.  Sinus tachycardia with a rate of 110.  No significant ST changes  Assessment/Plan Principal Problem:   Asthma exacerbation Active Problems:   Drug addiction ([place])     #1 acute exacerbation of asthma: Patient has failed outpatient treatment.  Initiate IV Solu-Medrol, nebulizer, oxygen.  No antibiotics.  Once stable transition to oral steroid taper dose for home use  #2 chronic methadone addiction: Continue methadone in the hospital to avoid withdrawals.   DVT prophylaxis: SCD Code Status: Full code Family Communication: No family at bedside Disposition Plan: Home Consults called: None Admission status: Observation  Severity of Illness: The appropriate patient status for this patient is OBSERVATION. Observation status is judged to be reasonable and necessary in order to provide the required intensity of service to ensure the  patient's safety. The patient's presenting symptoms, physical exam findings, and initial radiographic and laboratory data in the context of their medical condition is felt to place them at decreased risk for further clinical deterioration. Furthermore, it is anticipated that the patient will be medically stable for discharge from the hospital within 2 midnights of admission. The following factors support the patient status of observation.   " The patient's presenting symptoms include shortness of breath and wheeze. " The physical exam findings include mild expiratory wheezing. " The initial radiographic and laboratory data are no significant.     Barbette Merino MD [facility] Pager [phone]  If 7PM-7AM, please contact night-coverage [email or web address] Password [email or web address]  06/22/2019, 3:03 AM

## 2019-06-21 NOTE — ED Notes (Signed)
ED TO INPATIENT HANDOFF REPORT  Name/Age/Gender Banner [name]  Code Status Code Status History    Date Active Date Inactive Code Status Order ID Comments User Context   09/07/2011 0854 09/08/2011 1825 Full Code [id number]  Ivery Quale, RN ED   Advance Care Planning Activity      Home/SNF/Other Home  Chief Complaint SOB   Level of Care/Admitting Diagnosis ED Disposition    ED Disposition Condition [facility] Area: [facility] [[id number]]  Level of Care: Telemetry [5]  Admit to tele based on following criteria: Other see comments  Comments: hypoxia  Covid Evaluation: Asymptomatic Screening Protocol (No Symptoms)  Diagnosis: Asthma exacerbation [id number]  Admitting Physician: Elwyn Reach [[id number]]  Attending Physician: Elwyn Reach [[id number]]  Estimated length of stay: past midnight tomorrow  Certification:: I certify this patient will need inpatient services for at least 2 midnights  PT Class (Do Not Modify): Inpatient [101]  PT Acc Code (Do Not Modify): Private [1]       Medical History Past Medical History:  Diagnosis Date  . Asthma   . 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 Tendonitis     Allergies Allergies  Allergen Reactions  . Vicodin [Hydrocodone-Acetaminophen] Other (See Comments)    AVOID PAIN KILLERS OR ANY OTHER MEDS THAT MAY CAUSE ADDICTION  . Xanax [Alprazolam] Other (See Comments)    AVOID BENZOS OR ANY OTHER MEDS THAT MAY CAUSE ADDICTION    IV Location/Drains/Wounds Patient Lines/Drains/Airways Status   Active Line/Drains/Airways    Name:   Placement date:   Placement time:   Site:   Days:   Peripheral IV 06/21/19 Right;Upper Arm   06/21/19    1719    Arm   less than 1          Labs/Imaging Results for orders placed or performed during the hospital encounter of 06/21/19 (from the past 48 hour(s))  Comprehensive metabolic panel     Status: Abnormal   Collection Time: 06/21/19  5:12 PM   Result Value Ref Range   Sodium 139 135 - 145 mmol/L   Potassium 3.7 3.5 - 5.1 mmol/L   Chloride 103 98 - 111 mmol/L   CO2 20 (L) 22 - 32 mmol/L   Glucose, Bld 159 (H) 70 - 99 mg/dL   BUN 8 6 - 20 mg/dL   Creatinine, Ser 0.87 0.61 - 1.24 mg/dL   Calcium 9.3 8.9 - 10.3 mg/dL   Total Protein 8.8 (H) 6.5 - 8.1 g/dL   Albumin 4.0 3.5 - 5.0 g/dL   AST 25 15 - 41 U/L   ALT 23 0 - 44 U/L   Alkaline Phosphatase 121 38 - 126 U/L   Total Bilirubin 0.4 0.3 - 1.2 mg/dL   GFR calc non Af Amer >60 >60 mL/min   GFR calc Af Amer >60 >60 mL/min   Anion gap 16 (H) 5 - 15    Comment: Performed at [facility], [place] [address], [place], [place] [address]  Ethanol     Status: None   Collection Time: 06/21/19  5:12 PM  Result Value Ref Range   Alcohol, Ethyl (B) <10 <10 mg/dL    Comment: (NOTE) Lowest detectable limit for serum alcohol is 10 mg/dL. For medical purposes only. Performed at [facility], [place] [address], [place], [place] [address]   CBC with Differential     Status: Abnormal   Collection Time: 06/21/19  5:12 PM  Result Value Ref Range   WBC 9.7 4.0 - 10.5 K/uL   RBC 5.32 4.22 - 5.81 MIL/uL   Hemoglobin 14.7 13.0 - 17.0 g/dL   HCT 47.0 39.0 - 52.0 %   MCV 88.3 80.0 - 100.0 fL   MCH 27.6 26.0 - 34.0 pg   MCHC 31.3 30.0 - 36.0 g/dL   RDW 12.7 11.5 - 15.5 %   Platelets 248 150 - 400 K/uL   nRBC 0.0 0.0 - 0.2 %   Neutrophils Relative % 38 %   Neutro Abs 3.7 1.7 - 7.7 K/uL   Lymphocytes Relative 55 %   Lymphs Abs 5.3 (H) 0.7 - 4.0 K/uL   Monocytes Relative 5 %   Monocytes Absolute 0.5 0.1 - 1.0 K/uL   Eosinophils Relative 2 %   Eosinophils Absolute 0.2 0.0 - 0.5 K/uL   Basophils Relative 0 %   Basophils Absolute 0.0 0.0 - 0.1 K/uL   Immature Granulocytes 0 %   Abs Immature Granulocytes 0.03 0.00 - 0.07 K/uL    Comment: Performed at [facility], [place] [address], [place], [place] [address]   Dg Chest Port 1  View  Result Date: 06/21/2019 CLINICAL DATA:  Initial evaluation for acute respiratory distress, cough. EXAM: PORTABLE CHEST 1 VIEW COMPARISON:  Prior radiograph from 12/30/2018. FINDINGS: The cardiac and mediastinal silhouettes are stable in size and contour, and remain within normal limits. The lungs are normally inflated. No airspace consolidation, pleural effusion, or pulmonary edema is identified. There is no pneumothorax. No acute osseous abnormality identified. IMPRESSION: No radiographic evidence for active cardiopulmonary disease. Electronically Signed   By: Jeannine Boga M.D.   On: 06/21/2019 21:19    Pending Labs Unresulted Labs (From admission, onward)    Start     Ordered   06/21/19 1713  SARS Coronavirus 2 [facility] order, Performed in [facility] hospital lab) Nasopharyngeal Nasopharyngeal Swab  (Symptomatic/High Risk of Exposure/Tier 1 Patients Labs with Precautions)  Once,   STAT    Question Answer Comment  Is this test for diagnosis or screening Diagnosis of ill patient   Symptomatic for COVID-19 as defined by CDC Yes   Date of Symptom Onset 06/19/2019   Hospitalized for COVID-19 Yes   Admitted to ICU for COVID-19 No   Previously tested for COVID-19 No   Resident in a congregate (group) care setting No   Employed in healthcare setting No      06/21/19 1712   Signed and Held  HIV antibody (Routine Testing)  Once,   R     Signed and Held   Signed and Held  CBC  (enoxaparin (LOVENOX)    CrCl >/= 30 ml/min)  Once,   R    Comments: Baseline for enoxaparin therapy IF NOT ALREADY DRAWN.  Notify MD if PLT < 100 K.    Signed and Held   Signed and Held  Creatinine, serum  (enoxaparin (LOVENOX)    CrCl >/= 30 ml/min)  Once,   R    Comments: Baseline for enoxaparin therapy IF NOT ALREADY DRAWN.    Signed and Held   Signed and Held  Creatinine, serum  (enoxaparin (LOVENOX)    CrCl >/= 30 ml/min)  Weekly,   R    Comments: while on enoxaparin therapy    Signed and Held    Signed and Held  Comprehensive metabolic panel  Tomorrow morning,   R     Signed and Held   Signed  and Held  CBC  Tomorrow morning,   R     Signed and Held          Vitals/Pain Today's Vitals   06/21/19 2000 06/21/19 2015 06/21/19 2030 06/21/19 2045  BP: 125/76 112/69 118/66 114/67  Pulse: 90 80 82 67  Resp: 17 18 15 13   SpO2: 100% 100% 98% 100%  Weight:      Height:      PainSc:        Isolation Precautions Airborne and Contact precautions  Medications Medications  albuterol (VENTOLIN HFA) 108 (90 Base) MCG/ACT inhaler (  Not Given 06/21/19 1722)  albuterol (VENTOLIN HFA) 108 (90 Base) MCG/ACT inhaler 8 puff (has no administration in time range)  dexamethasone (DECADRON) injection 10 mg (10 mg Intravenous Given 06/21/19 1727)    Mobility walks

## 2019-06-22 ENCOUNTER — Other Ambulatory Visit: Payer: Self-pay

## 2019-06-22 MED ORDER — ALBUTEROL SULFATE (2.5 MG/3ML) 0.083% IN NEBU
2.5000 mg | INHALATION_SOLUTION | Freq: Three times a day (TID) | RESPIRATORY_TRACT | Status: DC
  Administered 2019-06-22 – 2019-06-23 (×3): 2.5 mg via RESPIRATORY_TRACT
  Filled 2019-06-22 (×3): qty 3

## 2019-06-22 MED ORDER — BENZONATATE 100 MG PO CAPS
100.0000 mg | ORAL_CAPSULE | Freq: Two times a day (BID) | ORAL | Status: DC
  Administered 2019-06-22 – 2019-06-23 (×3): 100 mg via ORAL
  Filled 2019-06-22 (×3): qty 1

## 2019-06-22 NOTE — Progress Notes (Signed)
[facility] Progress Note  Patient: [name] [id number]   PCP: Patient, No Pcp Per DOB: 24-Jan-1991   DOA: 06/21/2019   DOS: 06/22/2019   Date of Service: the patient was seen and examined on 06/22/2019  Brief hospital course: Pt. with PMH of asthma, chronic pain on methadone; admitted on 06/21/2019, presented with complaint of shortnes of breath , was found to have asthma exacerbation. Currently further plan is continue steroids and nebulization.  Subjective: Continues to have mild shortness of breath as well as cough.  Exertion makes the symptoms worse.  No nausea no vomiting.  Some chest pain reported as well.  Assessment and Plan: 1.  Asthma exacerbation. Acute hypoxic respiratory failure.  Continue Solu-Medrol, now oxygen.  Nebulizer. No indication of antibiotics for now. Wean oxygen. Patient mentions that he ran out of all his home medications.  2.  Chronic methadone use. Continue methadone.  3.  Active smoker. Nicotine patch 21 mg Patient is not ready to quit smoking right now.   Diet: Regular diet DVT Prophylaxis: Subcutaneous Lovenox  Advance goals of care discussion: Full code  Family Communication: no family was present at bedside, at the time of interview.  Disposition:  Discharge to Home .  Consultants: none Procedures: noen  Scheduled Meds: . albuterol  2.5 mg Nebulization TID  . benzonatate  100 mg Oral BID  . enoxaparin (LOVENOX) injection  40 mg Subcutaneous QHS  . methadone  120 mg Oral Q0600  . methylPREDNISolone (SOLU-MEDROL) injection  40 mg Intravenous Q6H  . nicotine  21 mg Transdermal Daily  . sertraline  50 mg Oral Daily   Continuous Infusions: PRN Meds: albuterol, ibuprofen, ondansetron **OR** ondansetron (ZOFRAN) IV Antibiotics: Anti-infectives (From admission, onward)   None      Objective: Physical Exam: Vitals:   06/22/19 0115 06/22/19 0617 06/22/19 0739 06/22/19 1500  BP:  131/77  118/71  Pulse:  78  76  Resp:   16  16  Temp:  98.6 F (37 C)  98.7 F (37.1 C)  TempSrc:  Oral  Oral  SpO2: 98% 99% 94% 94%  Weight:      Height:        Intake/Output Summary (Last 24 hours) at 06/22/2019 1741 Last data filed at 06/22/2019 0900 Gross per 24 hour  Intake 1165.69 ml  Output 640 ml  Net 525.69 ml   Filed Weights   06/21/19 1800  Weight: (S) 80 kg   General: alert and oriented to time, place, and person. Appear in mild distress, affect appropriate Eyes: PERRL, Conjunctiva normal ENT: Oral Mucosa Clear, moist  Neck: no JVD, no Abnormal Mass Or lumps Cardiovascular: S1 and S2 Present, no Murmur, peripheral pulses symmetrical Respiratory: normal respiratory effort, Bilateral Air entry equal and Decreased, no use of accessory muscle, no Crackles, Occasional  wheezes Abdomen: Bowel Sound present, Soft and no tenderness, no hernia Skin: no rashes  Extremities: no Pedal edema, no calf tenderness Neurologic: normal without focal findings, mental status, speech normal, alert and oriented x3, PERLA, Motor strength 5/5 and symmetric and sensation grossly normal to light touch Gait not checked due to patient safety concerns  Data Reviewed: CBC: Recent Labs  Lab 06/21/19 1712  WBC 9.7  NEUTROABS 3.7  HGB 14.7  HCT 47.0  MCV 88.3  PLT [id number]   Basic Metabolic Panel: Recent Labs  Lab 06/21/19 1712  NA 139  K 3.7  CL 103  CO2 20*  GLUCOSE 159*  BUN 8  CREATININE 0.87  CALCIUM  9.3    Liver Function Tests: Recent Labs  Lab 06/21/19 1712  AST 25  ALT 23  ALKPHOS 121  BILITOT 0.4  PROT 8.8*  ALBUMIN 4.0   No results for input(s): LIPASE, AMYLASE in the last 168 hours. No results for input(s): AMMONIA in the last 168 hours. Coagulation Profile: No results for input(s): INR, PROTIME in the last 168 hours. Cardiac Enzymes: No results for input(s): CKTOTAL, CKMB, CKMBINDEX, TROPONINI in the last 168 hours. BNP (last 3 results) No results for input(s): PROBNP in the last 8760 hours.  CBG: No results for input(s): GLUCAP in the last 168 hours. Studies: Dg Chest Port 1 View  Result Date: 06/21/2019 CLINICAL DATA:  Initial evaluation for acute respiratory distress, cough. EXAM: PORTABLE CHEST 1 VIEW COMPARISON:  Prior radiograph from 12/30/2018. FINDINGS: The cardiac and mediastinal silhouettes are stable in size and contour, and remain within normal limits. The lungs are normally inflated. No airspace consolidation, pleural effusion, or pulmonary edema is identified. There is no pneumothorax. No acute osseous abnormality identified. IMPRESSION: No radiographic evidence for active cardiopulmonary disease. Electronically Signed   By: Jeannine Boga M.D.   On: 06/21/2019 21:19     Time spent: 35 minutes  Author: Berle Mull, MD [facility] 06/22/2019 5:41 PM  To reach On-call, see care teams to locate the attending and reach out to them via [email or web address]. If 7PM-7AM, please contact night-coverage If you still have difficulty reaching the attending provider, please page the [facility] (Director on Call) for [facility] on amion for assistance.

## 2019-06-23 MED ORDER — PREDNISONE 10 MG PO TABS: 10.0000 mg | ORAL_TABLET | Freq: Every day | ORAL | 0 refills | Status: DC

## 2019-06-23 MED ORDER — NICOTINE 21 MG/24HR TD PT24: 21.0000 mg | MEDICATED_PATCH | Freq: Every day | TRANSDERMAL | 0 refills | Status: DC

## 2019-06-23 MED ORDER — BENZONATATE 100 MG PO CAPS: 100.0000 mg | ORAL_CAPSULE | Freq: Two times a day (BID) | ORAL | 0 refills | Status: DC

## 2019-06-23 MED ORDER — PREDNISONE 10 MG PO TABS: ORAL_TABLET | ORAL | 0 refills | Status: DC

## 2019-06-23 MED ORDER — IPRATROPIUM BROMIDE 0.02 % IN SOLN: 0.5000 mg | Freq: Four times a day (QID) | RESPIRATORY_TRACT | 0 refills | Status: AC | PRN

## 2019-06-23 MED ORDER — ALBUTEROL SULFATE HFA 108 (90 BASE) MCG/ACT IN AERS: 2.0000 | INHALATION_SPRAY | RESPIRATORY_TRACT | 0 refills | Status: DC | PRN

## 2019-06-23 MED ORDER — HYDROXYZINE HCL 25 MG PO TABS: 25.0000 mg | ORAL_TABLET | Freq: Three times a day (TID) | ORAL | 0 refills | Status: AC | PRN

## 2019-06-23 MED ORDER — ALBUTEROL SULFATE (2.5 MG/3ML) 0.083% IN NEBU: 2.5000 mg | INHALATION_SOLUTION | Freq: Four times a day (QID) | RESPIRATORY_TRACT | 0 refills | Status: AC | PRN

## 2019-06-23 MED ORDER — HYDROXYZINE HCL 25 MG PO TABS: 25.0000 mg | ORAL_TABLET | Freq: Three times a day (TID) | ORAL | 0 refills | Status: DC | PRN

## 2019-06-23 MED ORDER — ALBUTEROL SULFATE (2.5 MG/3ML) 0.083% IN NEBU: 2.5000 mg | INHALATION_SOLUTION | Freq: Four times a day (QID) | RESPIRATORY_TRACT | 0 refills | Status: DC | PRN

## 2019-06-23 MED FILL — BENZONATATE 100 MG CAPS: 100 | 10 days supply | Qty: 20 | Fill #0

## 2019-06-23 MED FILL — predniSONE 10 MG TABS: 10 | 12 days supply | Qty: 30 | Fill #0

## 2019-06-23 MED FILL — hydrOXYzine HCL 25 MG TABS: 25 | 10 days supply | Qty: 30 | Fill #0

## 2019-06-23 MED FILL — NICOTINE 21 MG/24HR PATCH: 21 | 28 days supply | Qty: 28 | Fill #0

## 2019-06-23 MED FILL — ALBUTEROL SULFATE HFA 108 (: 108 (90 BAS | 18 days supply | Qty: 9 | Fill #0

## 2019-06-23 MED FILL — ALBUTEROL SUL 2.5 MG/3 ML S: (2.5 MG/3ML | 6 days supply | Qty: 75 | Fill #0

## 2019-06-23 NOTE — Plan of Care (Signed)

## 2019-06-25 NOTE — Discharge Summary (Signed)
[facility] Discharge Summary   Patient: [name] [id number]   PCP: Patient, No Pcp Per DOB: [birth year]   Date of admission: 06/21/2019   Date of discharge: 06/23/2019     Discharge Diagnoses:   Principal Problem:   Asthma exacerbation Active Problems:   Drug addiction ([place])   Admitted From: home Disposition:  Home   Recommendations for Outpatient Follow-up:  1. Please follow up with PCP in 1week   Follow-up Information    PCP. Schedule an appointment as soon as possible for a visit in 2 week(s).          Diet recommendation: regular diet  Activity: The patient is advised to gradually reintroduce usual activities,as tolerated .  Discharge Condition: good  Code Status: full code  History of present illness: As per the H and P dictated on admission, "[name] is a 28 y.o. male with medical history significant of asthma, chronic pain on methadone who presents to the ER with progressive shortness of breath and cough for 3 days.  He took his breathing treatments at home including nebulizer with no significant help.  He continues to have significant shortness of breath cough and wheezing.  Patient came to the ER where he was seen and evaluated.  He was continuously wheezing.  He was given inhalers in the ER.  COVID-19 was checked which came back negative and patient received nebulizer treatment.  Also steroids.  He was still having significant shortness of breath and wheeze.  Patient is therefore being admitted to the hospital for inpatient care having failed outpatient therapy."  Hospital Course:  Summary of his active problems in the hospital is as following. 1.  Asthma exacerbation. Acute hypoxic respiratory failure.  Treated with Solu-Medrol, oxygen.  Nebulizer. No indication of antibiotics for now. Patient reports that he ran out of all his medications. Patient ambulated 100 feet without any requirement for oxygen. At the time of discharge still has  occasional wheezing. Recommend follow-up with PCP.  2.  Chronic methadone use. Continue methadone.  3.  Active smoker. Nicotine patch 21 mg Patient is not ready to quit smoking right now.  4.  Anxiety. PRN Atarax ordered for discharge for 5 days. Recommend following up psychiatry which the patient states Beverly Sessions is a psychiatric provider.  Patient was ambulatory without any assistance. On the day of the discharge the patient's vitals were stable, and no other acute medical condition were reported by patient. the patient was felt safe to be discharge at Home with no therapy needed on discharge.  Consultants: none Procedures: none  DISCHARGE MEDICATION: Allergies as of 06/23/2019      Reactions   Vicodin [hydrocodone-acetaminophen] Other (See Comments)   AVOID PAIN KILLERS OR ANY OTHER MEDS THAT MAY CAUSE ADDICTION   Xanax [alprazolam] Other (See Comments)   AVOID BENZOS OR ANY OTHER MEDS THAT MAY CAUSE ADDICTION      Medication List    STOP taking these medications   amoxicillin-clavulanate 875-125 MG tablet Commonly known as: AUGMENTIN     TAKE these medications   albuterol (2.5 MG/3ML) 0.083% nebulizer solution Commonly known as: PROVENTIL Take 3 mLs (2.5 mg total) by nebulization every 6 (six) hours as needed for wheezing or shortness of breath.   albuterol 108 (90 Base) MCG/ACT inhaler Commonly known as: VENTOLIN HFA Inhale 2 puffs into the lungs every 4 (four) hours as needed for wheezing or shortness of breath.   benzonatate 100 MG capsule Commonly known as: TESSALON Take 1 capsule (100  mg total) by mouth 2 (two) times daily.   hydrOXYzine 25 MG tablet Commonly known as: ATARAX/VISTARIL Take 1 tablet (25 mg total) by mouth 3 (three) times daily as needed for anxiety.   ibuprofen 200 MG tablet Commonly known as: ADVIL Take 800 mg by mouth daily as needed for headache or mild pain.   ibuprofen 600 MG tablet Commonly known as: ADVIL Take 1 tablet (600 mg  total) by mouth every 8 (eight) hours as needed.   ipratropium 0.02 % nebulizer solution Commonly known as: ATROVENT Take 2.5 mLs (0.5 mg total) by nebulization every 6 (six) hours as needed for wheezing or shortness of breath. What changed:   when to take this  reasons to take this   methadone 1 mg/ml  oral solution Commonly known as: DOLOPHINE Take 120 mg/kg by mouth daily.   nicotine 21 mg/24hr patch Commonly known as: NICODERM CQ - dosed in mg/24 hours Place 1 patch (21 mg total) onto the skin daily.   predniSONE 10 MG tablet Commonly known as: DELTASONE Take 40mg  daily for 3days,Take 30mg  daily for 3days,Take 20mg  daily for 3days,Take 10mg  daily for 3days, then stop What changed:   medication strength  how much to take  how to take this  when to take this  additional instructions   sertraline 50 MG tablet Commonly known as: ZOLOFT Take 50 mg by mouth daily.      Allergies  Allergen Reactions  . Vicodin [Hydrocodone-Acetaminophen] Other (See Comments)    AVOID PAIN KILLERS OR ANY OTHER MEDS THAT MAY CAUSE ADDICTION  . Xanax [Alprazolam] Other (See Comments)    AVOID BENZOS OR ANY OTHER MEDS THAT MAY CAUSE ADDICTION   Discharge Instructions    Diet general   Complete by: As directed    Discharge instructions   Complete by: As directed    It is important that you read the given instructions as well as go over your medication list with RN to help you understand your care after this hospitalization.  Discharge Instructions: Please follow-up with PCP in 1-2 weeks  Please request your primary care physician to go over all Hospital Tests and Procedure/Radiological results at the follow up. Please get all Hospital records sent to your PCP by signing hospital release before you go home.   You were cared for by a hospitalist during your hospital stay. If you have any questions about your discharge medications or the care you received while you were in the  hospital after you are discharged, you can call the unit @UNIT @ you were admitted to and ask to speak with the hospitalist on call if the hospitalist that took care of you is not available.  Once you are discharged, your primary care physician will handle any further medical issues. Please note that NO REFILLS for any discharge medications will be authorized once you are discharged, as it is imperative that you return to your primary care physician (or establish a relationship with a primary care physician if you do not have one) for your aftercare needs so that they can reassess your need for medications and monitor your lab values. You Must read complete instructions/literature along with all the possible adverse reactions/side effects for all the Medicines you take and that have been prescribed to you. Take any new Medicines after you have completely understood and accept all the possible adverse reactions/side effects. Wear Seat belts while driving. If you have smoked or chewed Tobacco in the last 2 yrs please stop smoking  and/or stop any Recreational drug use.  If you drink alcohol, please moderate the use and do not drive, operating heavy machinery, perform activities at heights, swimming or participation in water activities or provide baby sitting services under influence.   Increase activity slowly   Complete by: As directed      Discharge Exam: Filed Weights   06/21/19 1800  Weight: (S) 80 kg   Vitals:   06/23/19 0416 06/23/19 0804  BP: 116/68   Pulse: 67   Resp: 18   Temp: 97.7 F (36.5 C)   SpO2: 96% 95%   General: Appear in mild distress, no Rash; Oral Mucosa Clear, moist. no Abnormal Mass Or lumps Cardiovascular: S1 and S2 Present, no Murmur, Respiratory: normal respiratory effort, Bilateral Air entry present and no Crackles, Occasional wheezes Abdomen: Bowel Sound present, Soft and no tenderness, no hernia Extremities: no Pedal edema, no calf tenderness Neurology: alert  and oriented to time, place, and person affect appropriate. normal without focal findings, mental status, speech normal, alert and oriented x3, PERLA, Motor strength 5/5 and symmetric and sensation grossly normal to light touch   The results of significant diagnostics from this hospitalization (including imaging, microbiology, ancillary and laboratory) are listed below for reference.    Significant Diagnostic Studies: Dg Chest Port 1 View  Result Date: 06/21/2019 CLINICAL DATA:  Initial evaluation for acute respiratory distress, cough. EXAM: PORTABLE CHEST 1 VIEW COMPARISON:  Prior radiograph from 12/30/2018. FINDINGS: The cardiac and mediastinal silhouettes are stable in size and contour, and remain within normal limits. The lungs are normally inflated. No airspace consolidation, pleural effusion, or pulmonary edema is identified. There is no pneumothorax. No acute osseous abnormality identified. IMPRESSION: No radiographic evidence for active cardiopulmonary disease. Electronically Signed   By: Jeannine Boga M.D.   On: 06/21/2019 21:19    Microbiology: Recent Results (from the past 240 hour(s))  SARS Coronavirus 2 [facility] order, Performed in [facility] hospital lab) Nasopharyngeal Nasopharyngeal Swab     Status: None   Collection Time: 06/21/19  5:13 PM   Specimen: Nasopharyngeal Swab  Result Value Ref Range Status   SARS Coronavirus 2 NEGATIVE NEGATIVE Final    Comment: (NOTE) If result is NEGATIVE SARS-CoV-2 target nucleic acids are NOT DETECTED. The SARS-CoV-2 RNA is generally detectable in upper and lower  respiratory specimens during the acute phase of infection. The lowest  concentration of SARS-CoV-2 viral copies this assay can detect is 250  copies / mL. A negative result does not preclude SARS-CoV-2 infection  and should not be used as the sole basis for treatment or other  patient management decisions.  A negative result may occur with  improper specimen collection /  handling, submission of specimen other  than nasopharyngeal swab, presence of viral mutation(s) within the  areas targeted by this assay, and inadequate number of viral copies  (<250 copies / mL). A negative result must be combined with clinical  observations, patient history, and epidemiological information. If result is POSITIVE SARS-CoV-2 target nucleic acids are DETECTED. The SARS-CoV-2 RNA is generally detectable in upper and lower  respiratory specimens dur ing the acute phase of infection.  Positive  results are indicative of active infection with SARS-CoV-2.  Clinical  correlation with patient history and other diagnostic information is  necessary to determine patient infection status.  Positive results do  not rule out bacterial infection or co-infection with other viruses. If result is PRESUMPTIVE POSTIVE SARS-CoV-2 nucleic acids MAY BE PRESENT.   A presumptive  positive result was obtained on the submitted specimen  and confirmed on repeat testing.  While 2019 novel coronavirus  (SARS-CoV-2) nucleic acids may be present in the submitted sample  additional confirmatory testing may be necessary for epidemiological  and / or clinical management purposes  to differentiate between  SARS-CoV-2 and other Sarbecovirus currently known to infect humans.  If clinically indicated additional testing with an alternate test  methodology [phone]) is advised. The SARS-CoV-2 RNA is generally  detectable in upper and lower respiratory sp ecimens during the acute  phase of infection. The expected result is Negative. Fact Sheet for Patients:  StrictlyIdeas.no Fact Sheet for Healthcare Providers: BankingDealers.co.za This test is not yet approved or cleared by the [place] FDA and has been authorized for detection and/or diagnosis of SARS-CoV-2 by FDA under an Emergency Use Authorization (EUA).  This EUA will remain in effect (meaning this  test can be used) for the duration of the COVID-19 declaration under Section 564(b)(1) of the Act, 21 U.S.C. section 360bbb-3(b)(1), unless the authorization is terminated or revoked sooner. Performed at [facility], [place] [address], [place], [place] [address]      Labs: CBC: Recent Labs  Lab 06/21/19 1712  WBC 9.7  NEUTROABS 3.7  HGB 14.7  HCT 47.0  MCV 88.3  PLT [id number]   Basic Metabolic Panel: Recent Labs  Lab 06/21/19 1712  NA 139  K 3.7  CL 103  CO2 20*  GLUCOSE 159*  BUN 8  CREATININE 0.87  CALCIUM 9.3   Liver Function Tests: Recent Labs  Lab 06/21/19 1712  AST 25  ALT 23  ALKPHOS 121  BILITOT 0.4  PROT 8.8*  ALBUMIN 4.0   No results for input(s): LIPASE, AMYLASE in the last 168 hours. No results for input(s): AMMONIA in the last 168 hours. Cardiac Enzymes: No results for input(s): CKTOTAL, CKMB, CKMBINDEX, TROPONINI in the last 168 hours. BNP (last 3 results) No results for input(s): BNP in the last 8760 hours. CBG: No results for input(s): GLUCAP in the last 168 hours. Time spent: 35 minutes  Signed:  Berle Mull  [facility] 06/23/2019

## 2019-08-31 ENCOUNTER — Other Ambulatory Visit: Payer: Self-pay

## 2019-08-31 ENCOUNTER — Emergency Department (HOSPITAL_COMMUNITY): Payer: Medicaid Other

## 2019-08-31 ENCOUNTER — Encounter (HOSPITAL_COMMUNITY): Payer: Self-pay | Admitting: Emergency Medicine

## 2019-08-31 ENCOUNTER — Inpatient Hospital Stay (HOSPITAL_COMMUNITY)
Admission: EM | Admit: 2019-08-31 | Discharge: 2019-09-02 | DRG: 202 | Disposition: A | Discharge status: Home / Self Care | Payer: Medicaid Other | Attending: Internal Medicine | Admitting: Internal Medicine

## 2019-08-31 ENCOUNTER — Emergency Department (HOSPITAL_COMMUNITY)
Admission: EM | Admit: 2019-08-31 | Discharge: 2019-08-31 | Discharge status: Against Medical Advice | Payer: Medicaid Other | Attending: Emergency Medicine | Admitting: Emergency Medicine

## 2019-08-31 DIAGNOSIS — F17210 Nicotine dependence, cigarettes, uncomplicated: Secondary | ICD-10-CM | POA: Diagnosis present

## 2019-08-31 DIAGNOSIS — J4531 Mild persistent asthma with (acute) exacerbation: Principal | ICD-10-CM | POA: Diagnosis present

## 2019-08-31 DIAGNOSIS — R0602 Shortness of breath: Secondary | ICD-10-CM

## 2019-08-31 DIAGNOSIS — Z5329 Procedure and treatment not carried out because of patient's decision for other reasons: Secondary | ICD-10-CM | POA: Insufficient documentation

## 2019-08-31 DIAGNOSIS — F1120 Opioid dependence, uncomplicated: Secondary | ICD-10-CM | POA: Diagnosis present

## 2019-08-31 DIAGNOSIS — F1920 Other psychoactive substance dependence, uncomplicated: Secondary | ICD-10-CM | POA: Diagnosis present

## 2019-08-31 DIAGNOSIS — Z791 Long term (current) use of non-steroidal anti-inflammatories (NSAID): Secondary | ICD-10-CM

## 2019-08-31 DIAGNOSIS — Z79899 Other long term (current) drug therapy: Secondary | ICD-10-CM | POA: Insufficient documentation

## 2019-08-31 DIAGNOSIS — R61 Generalized hyperhidrosis: Secondary | ICD-10-CM | POA: Insufficient documentation

## 2019-08-31 DIAGNOSIS — J45909 Unspecified asthma, uncomplicated: Secondary | ICD-10-CM | POA: Diagnosis present

## 2019-08-31 DIAGNOSIS — R05 Cough: Secondary | ICD-10-CM | POA: Insufficient documentation

## 2019-08-31 DIAGNOSIS — Z20828 Contact with and (suspected) exposure to other viral communicable diseases: Secondary | ICD-10-CM | POA: Diagnosis present

## 2019-08-31 DIAGNOSIS — J45901 Unspecified asthma with (acute) exacerbation: Secondary | ICD-10-CM | POA: Diagnosis present

## 2019-08-31 DIAGNOSIS — Z7952 Long term (current) use of systemic steroids: Secondary | ICD-10-CM

## 2019-08-31 LAB — CBC WITH DIFFERENTIAL/PLATELET
Abs Immature Granulocytes: 0.01 10*3/uL (ref 0.00–0.07)
Basophils Absolute: 0 10*3/uL (ref 0.0–0.1)
Basophils Relative: 1 %
Eosinophils Absolute: 0.2 10*3/uL (ref 0.0–0.5)
Eosinophils Relative: 5 %
HCT: 37.2 % — ABNORMAL LOW (ref 39.0–52.0)
Hemoglobin: 12 g/dL — ABNORMAL LOW (ref 13.0–17.0)
Immature Granulocytes: 0 %
Lymphocytes Relative: 26 %
Lymphs Abs: 1.2 10*3/uL (ref 0.7–4.0)
MCH: 27.8 pg (ref 26.0–34.0)
MCHC: 32.3 g/dL (ref 30.0–36.0)
MCV: 86.1 fL (ref 80.0–100.0)
Monocytes Absolute: 0.3 10*3/uL (ref 0.1–1.0)
Monocytes Relative: 6 %
Neutro Abs: 2.9 10*3/uL (ref 1.7–7.7)
Neutrophils Relative %: 62 %
Platelets: 182 10*3/uL (ref 150–400)
RBC: 4.32 MIL/uL (ref 4.22–5.81)
RDW: 12.8 % (ref 11.5–15.5)
WBC: 4.7 10*3/uL (ref 4.0–10.5)
nRBC: 0 % (ref 0.0–0.2)

## 2019-08-31 LAB — COMPREHENSIVE METABOLIC PANEL
ALT: 14 U/L (ref 0–44)
ALT: 13 U/L (ref 0–44)
AST: 16 U/L (ref 15–41)
AST: 15 U/L (ref 15–41)
Albumin: 3.3 g/dL — ABNORMAL LOW (ref 3.5–5.0)
Albumin: 3.3 g/dL — ABNORMAL LOW (ref 3.5–5.0)
Alkaline Phosphatase: 77 U/L (ref 38–126)
Alkaline Phosphatase: 80 U/L (ref 38–126)
Anion gap: 11 (ref 5–15)
Anion gap: 8 (ref 5–15)
BUN: 13 mg/dL (ref 6–20)
BUN: 13 mg/dL (ref 6–20)
CO2: 23 mmol/L (ref 22–32)
CO2: 26 mmol/L (ref 22–32)
Calcium: 9 mg/dL (ref 8.9–10.3)
Calcium: 9 mg/dL (ref 8.9–10.3)
Chloride: 106 mmol/L (ref 98–111)
Chloride: 107 mmol/L (ref 98–111)
Creatinine, Ser: 0.78 mg/dL (ref 0.61–1.24)
Creatinine, Ser: 0.77 mg/dL (ref 0.61–1.24)
GFR calc Af Amer: >60 mL/min (ref >60)
GFR calc Af Amer: >60 mL/min (ref >60)
GFR calc non Af Amer: >60 mL/min (ref >60)
GFR calc non Af Amer: >60 mL/min (ref >60)
Glucose, Bld: 119 mg/dL — ABNORMAL HIGH (ref 70–99)
Glucose, Bld: 109 mg/dL — ABNORMAL HIGH (ref 70–99)
Potassium: 3.9 mmol/L (ref 3.5–5.1)
Potassium: 4.2 mmol/L (ref 3.5–5.1)
Sodium: 140 mmol/L (ref 135–145)
Sodium: 141 mmol/L (ref 135–145)
Total Bilirubin: 0.4 mg/dL (ref 0.3–1.2)
Total Bilirubin: 0.5 mg/dL (ref 0.3–1.2)
Total Protein: 7 g/dL (ref 6.5–8.1)
Total Protein: 7.2 g/dL (ref 6.5–8.1)

## 2019-08-31 LAB — SARS CORONAVIRUS 2 (TAT 6-24 HRS): SARS Coronavirus 2: NEGATIVE

## 2019-08-31 MED ORDER — MAGNESIUM SULFATE 2 GM/50ML IV SOLN: 2.0000 g | Freq: Once | INTRAVENOUS | Status: DC

## 2019-08-31 MED ORDER — METHADONE HCL 10 MG/ML PO CONC
120.0000 mg | Freq: Every day | ORAL | Status: DC
  Filled 2019-08-31: qty 12

## 2019-08-31 MED ORDER — ONDANSETRON HCL 4 MG/2ML IJ SOLN: 4.0000 mg | Freq: Four times a day (QID) | INTRAMUSCULAR | Status: DC | PRN

## 2019-08-31 MED ORDER — ENOXAPARIN SODIUM 40 MG/0.4ML ~~LOC~~ SOLN
40.0000 mg | SUBCUTANEOUS | Status: DC
  Administered 2019-08-31 – 2019-09-01 (×2): 40 mg via SUBCUTANEOUS
  Filled 2019-08-31 (×2): qty 0.4

## 2019-08-31 MED ORDER — ALBUTEROL SULFATE HFA 108 (90 BASE) MCG/ACT IN AERS
5.0000 | INHALATION_SPRAY | Freq: Once | RESPIRATORY_TRACT | Status: AC
  Administered 2019-08-31: 5 via RESPIRATORY_TRACT
  Filled 2019-08-31: qty 6.7

## 2019-08-31 MED ORDER — HYDROXYZINE HCL 25 MG PO TABS
25.0000 mg | ORAL_TABLET | Freq: Three times a day (TID) | ORAL | Status: DC | PRN
  Administered 2019-08-31 – 2019-09-01 (×3): 25 mg via ORAL
  Filled 2019-08-31 (×3): qty 1

## 2019-08-31 MED ORDER — METHYLPREDNISOLONE SODIUM SUCC 125 MG IJ SOLR
60.0000 mg | Freq: Two times a day (BID) | INTRAMUSCULAR | Status: DC
  Administered 2019-08-31 – 2019-09-02 (×4): 60 mg via INTRAVENOUS
  Filled 2019-08-31 (×4): qty 2

## 2019-08-31 MED ORDER — GUAIFENESIN ER 600 MG PO TB12
600.0000 mg | ORAL_TABLET | Freq: Two times a day (BID) | ORAL | Status: DC
  Administered 2019-08-31 (×2): 600 mg via ORAL
  Filled 2019-08-31 (×2): qty 1

## 2019-08-31 MED ORDER — ONDANSETRON HCL 4 MG PO TABS: 4.0000 mg | ORAL_TABLET | Freq: Four times a day (QID) | ORAL | Status: DC | PRN

## 2019-08-31 MED ORDER — ALBUTEROL SULFATE HFA 108 (90 BASE) MCG/ACT IN AERS: 4.0000 | INHALATION_SPRAY | Freq: Once | RESPIRATORY_TRACT | Status: DC

## 2019-08-31 MED ORDER — IPRATROPIUM BROMIDE HFA 17 MCG/ACT IN AERS: 2.0000 | INHALATION_SPRAY | RESPIRATORY_TRACT | Status: DC

## 2019-08-31 MED ORDER — SENNOSIDES-DOCUSATE SODIUM 8.6-50 MG PO TABS: 1.0000 | ORAL_TABLET | Freq: Every evening | ORAL | Status: DC | PRN

## 2019-08-31 MED ORDER — IPRATROPIUM-ALBUTEROL 0.5-2.5 (3) MG/3ML IN SOLN
3.0000 mL | Freq: Four times a day (QID) | RESPIRATORY_TRACT | Status: DC
  Administered 2019-08-31 – 2019-09-02 (×7): 3 mL via RESPIRATORY_TRACT
  Filled 2019-08-31 (×7): qty 3

## 2019-08-31 MED ORDER — PREDNISONE 20 MG PO TABS
60.0000 mg | ORAL_TABLET | Freq: Once | ORAL | Status: AC
  Administered 2019-08-31: 60 mg via ORAL
  Filled 2019-08-31: qty 3

## 2019-08-31 MED ORDER — MAGNESIUM SULFATE 2 GM/50ML IV SOLN
2.0000 g | Freq: Once | INTRAVENOUS | Status: AC
  Administered 2019-08-31: 2 g via INTRAVENOUS
  Filled 2019-08-31: qty 50

## 2019-08-31 MED ORDER — SERTRALINE HCL 50 MG PO TABS
50.0000 mg | ORAL_TABLET | Freq: Every day | ORAL | Status: DC
  Administered 2019-08-31 – 2019-09-01 (×2): 50 mg via ORAL
  Filled 2019-08-31 (×2): qty 1

## 2019-08-31 MED ORDER — ALBUTEROL SULFATE HFA 108 (90 BASE) MCG/ACT IN AERS
1.0000 | INHALATION_SPRAY | Freq: Four times a day (QID) | RESPIRATORY_TRACT | Status: DC
  Administered 2019-08-31 (×2): 2 via RESPIRATORY_TRACT

## 2019-08-31 MED ORDER — NICOTINE 21 MG/24HR TD PT24
21.0000 mg | MEDICATED_PATCH | Freq: Every day | TRANSDERMAL | Status: DC
  Administered 2019-08-31 – 2019-09-02 (×3): 21 mg via TRANSDERMAL
  Filled 2019-08-31 (×3): qty 1

## 2019-08-31 NOTE — ED Triage Notes (Signed)
Patient here from methadone clinic, seen earlier this morning. Left AMA to get methadone. Reports asthma symptoms for 2 days. Unable to give self neb treatments, reports no electricity.

## 2019-08-31 NOTE — ED Triage Notes (Signed)
Patient reports having an asthma attack for two days, he has taken mucinex and is currently giving himself a breathing treatment in triage. Reports hx of IV drug use and asthma.

## 2019-08-31 NOTE — ED Notes (Signed)
Pt ambulated from triage to room 17 without assistance. Gait steady

## 2019-08-31 NOTE — ED Notes (Signed)
Pt was found in the lobby using his person nebulizer breathing treatment. Pt was advised that this was not allowed. Pt stated that he was has asthma and his power was out at his house. This Probation officer informed the pt again that he was not allowed to use his nebulizer in the lobby. Pt was brought to a triage room and checked in.

## 2019-08-31 NOTE — ED Notes (Signed)
Attempted to start IV per verbal from PA. Pt refusing stating "you are not sticking an IV in me because then I will be stuck here." PA explained the importance of an IV due to his medical condition. Pt adamantly refusing stating "no I am on a strict methadone schedule dose and if I get an IV then I am stuck here. Can you guarantee at 10 when I need to leave that the IV will be out." Pt refusing to be stuck by staff.

## 2019-08-31 NOTE — ED Notes (Signed)
XR at bedside

## 2019-08-31 NOTE — ED Notes (Signed)
Patient comes out his room yelling at this RN that he is leaving to go dose his methadone. Patient requested to put his mask back on. New patient mask given to patient. Beverley Fiedler, [place] made aware patient is leaving AMA.

## 2019-08-31 NOTE — H&P (Signed)
History and Physical  [name] [id number] DOB: 11-16-1990 DOA: 08/31/2019  PCP: Patient, No Pcp Per Patient coming from: Home   I have personally briefly reviewed patient's old medical records in [place]   Chief Complaint: SOB.   HPI: [name] is a 28 y.o. male past medical history significant for asthma, methadone dependence who presented complaining of shortness of breath.  Patient presented complaining of shortness of breath and cough that started today prior to admission.  He denies fever, sore throat, loss of taste.  He report productive cough.  He was taking some amoxicillin and a steroid that he has left from prior flares.  He lost power at home and was unable to use his nebulizer machine.  He denies chest pain, abdominal pain, nausea vomiting diarrhea.  He has not been using heroine.  Last time that he used cocaine was a week ago.   Evaluation in the ED: Sodium 141, potassium 4.2, BUN 13, creatinine 0.7, albumin 3.3, AST 15 ALT 13, hemoglobin 12, white blood cell 4.7, platelets 182.  Chest x-ray no acute cardiopulmonary disease, S ARS coronavirus 2 pending  Patient received in the ED albuterol inhale, IV magnesium, oral prednisone, without significant improvement.  Review of Systems: All systems reviewed and apart from history of presenting illness, are negative.  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 He has a 2.50 pack-year smoking history. He has never used smokeless tobacco. He reports previous alcohol use. He reports previous drug use. Drugs:  Heroin, IV, and Cocaine.   Allergies  Allergen Reactions   Vicodin [Hydrocodone-Acetaminophen] Other (See Comments)    AVOID PAIN KILLERS OR ANY OTHER MEDS THAT MAY CAUSE ADDICTION   Xanax [Alprazolam] Other (See Comments)    AVOID BENZOS OR ANY OTHER MEDS THAT MAY CAUSE ADDICTION   Past medical history: Father with no known medical history, mother history of depression.  Prior to Admission medications   Medication Sig Start Date End Date Taking? Authorizing Provider  albuterol (PROVENTIL) (2.5 MG/3ML) 0.083% nebulizer solution Take 3 mLs (2.5 mg total) by nebulization every 6 (six) hours as needed for wheezing or shortness of breath. 06/23/19  Yes Lavina Hamman, MD  albuterol (VENTOLIN HFA) 108 (90 Base) MCG/ACT inhaler Inhale 2 puffs into the lungs every 4 (four) hours as needed for wheezing or shortness of breath. 06/23/19  Yes Lavina Hamman, MD  hydrOXYzine (ATARAX/VISTARIL) 25 MG tablet Take 1 tablet (25 mg total) by mouth 3 (three) times daily as needed for anxiety. 06/23/19  Yes Lavina Hamman, MD  ibuprofen (ADVIL,MOTRIN) 200 MG tablet Take 800 mg by mouth daily as needed for headache or mild pain.    Yes [provider]  ipratropium (ATROVENT) 0.02 % nebulizer solution Take 2.5 mLs (0.5 mg total) by nebulization every 6 (six) hours as needed for wheezing or shortness of breath. 06/23/19  Yes Lavina Hamman, MD  methadone (DOLOPHINE) 1 mg/ml oral solution Take 120 mg/kg by mouth daily.    Yes [provider]  sertraline (ZOLOFT) 50 MG  tablet Take 50 mg by mouth daily.   Yes [provider]  benzonatate (TESSALON) 100 MG capsule Take 1 capsule (100 mg total) by mouth 2 (two) times daily. Patient not taking: Reported on 08/31/2019 06/23/19   Lavina Hamman, MD  ibuprofen (ADVIL,MOTRIN) 600 MG tablet Take 1 tablet (600 mg total) by mouth every 8 (eight) hours as needed. Patient not taking: Reported on 12/30/2018 12/19/16   Clent Demark, PA-C  nicotine  (NICODERM CQ - DOSED IN MG/24 HOURS) 21 mg/24hr patch Place 1 patch (21 mg total) onto the skin daily. Patient not taking: Reported on 08/31/2019 06/23/19   Lavina Hamman, MD  predniSONE (DELTASONE) 10 MG tablet Take 40mg  daily for 3days,Take 30mg  daily for 3days,Take 20mg  daily for 3days,Take 10mg  daily for 3days, then stop Patient not taking: Reported on 08/31/2019 06/23/19   Lavina Hamman, MD   Physical Exam: Vitals:   08/31/19 1024 08/31/19 1130 08/31/19 1200 08/31/19 1402  BP: 138/81 129/76 135/69 (!) 143/91  Pulse: 63 62  69  Resp: 20 10  18   Temp:    98.1 F (36.7 C)  TempSrc:    Oral  SpO2: 95% 96% 95% 97%     General exam: Moderately built and nourished patient, lying comfortably supine on the gurney in no obvious distress.  Head, eyes and ENT: Nontraumatic and normocephalic. Pupils equally reacting to light and accommodation. Oral mucosa moist.  Neck: Supple. No JVD, carotid bruit or thyromegaly.  Lymphatics: No lymphadenopathy.  Respiratory system: Mild tachypnea, bilateral expiratory wheezing  Cardiovascular system: S1 and S2 heard, RRR. No JVD, murmurs, gallops, clicks or pedal edema.  Gastrointestinal system: Abdomen is nondistended, soft and nontender. Normal bowel sounds heard. No organomegaly or masses appreciated.  Central nervous system: Alert and oriented. No focal neurological deficits.  Extremities: Symmetric 5 x 5 power. Peripheral pulses symmetrically felt.   Skin: Multiple needles marked and scar tissue arms  Musculoskeletal system: Negative exam.  Psychiatry: Pleasant and cooperative.   Labs on Admission:  Basic Metabolic Panel: Recent Labs  Lab 08/31/19 1018 08/31/19 1135  NA 140 141  K 3.9 4.2  CL 106 107  CO2 23 26  GLUCOSE 119* 109*  BUN 13 13  CREATININE 0.78 0.77  CALCIUM 9.0 9.0   Liver Function Tests: Recent Labs  Lab 08/31/19 1018 08/31/19 1135  AST 16 15  ALT 14 13  ALKPHOS 77 80  BILITOT 0.4 0.5  PROT 7.0 7.2    ALBUMIN 3.3* 3.3*   No results for input(s): LIPASE, AMYLASE in the last 168 hours. No results for input(s): AMMONIA in the last 168 hours. CBC: Recent Labs  Lab 08/31/19 1135  WBC 4.7  NEUTROABS 2.9  HGB 12.0*  HCT 37.2*  MCV 86.1  PLT 182   Cardiac Enzymes: No results for input(s): CKTOTAL, CKMB, CKMBINDEX, TROPONINI in the last 168 hours.  BNP (last 3 results) No results for input(s): PROBNP in the last 8760 hours. CBG: No results for input(s): GLUCAP in the last 168 hours.  Radiological Exams on Admission: Dg Chest Portable 1 View  Result Date: 08/31/2019 CLINICAL DATA:  Shortness of breath. EXAM: PORTABLE CHEST 1 VIEW COMPARISON:  06/21/2019. FINDINGS: Mediastinum and hilar structures normal. Heart size normal. No focal infiltrate. No pleural effusion or pneumothorax. Chest is stable from prior exam. IMPRESSION: No acute cardiopulmonary disease.  Stable chest from prior exam. Electronically Signed   By: [place]   On: 08/31/2019 06:58    EKG:  Independently reviewed. Sinus rhythm.   Assessment/Plan Active Problems:   Asthma   Drug addiction ([place])   Asthma exacerbation   1-Asthma Exacerbation;  Patient presented with worsening shortness of breath, bilateral wheezing.  Denies fever, chest x-ray negative for pneumonia. Admit to the hospital for treatment of asthma exacerbation.  Start IV Solu-Medrol 60 mg IV twice daily. Will use albuterol and ipratropium inhale until we get Covid test result. Guaifenesin twice daily ordered.  2-Drug addiction: On chronic methadone, I have order meds. Counseling provided regarding drug use  3-COVID-19 screening pending.     DVT Prophylaxis: Lovenox Code Status: Full code Family Communication: Care discussed with patient Disposition Plan: Admit to the hospital under observation for treatment of asthma exacerbation.  Time spent: 75 minutes.   Elmarie Shiley MD [facility]   08/31/2019, 5:15 PM

## 2019-08-31 NOTE — ED Provider Notes (Addendum)
[place] DEPT Provider Note   CSN: [id number] Arrival date & time: 08/31/19  [id number]     History   Chief Complaint Chief Complaint  Patient presents with   Asthma    HPI [name] is a 28 y.o. male.     28 y.o male with a past medical history of methadone dependence, asthma presents to the ED with complaints of shortness of breath x2 days.  Patient was evaluated in the ED by me earlier this morning, reports he had to leave due to being late to obtain his methadone dosing, was advised to return afterwards he left AGAINST MEDICAL ADVICE previously.  He reports his shortness of breath has been worsening throughout the days, he also states he is been using his nebulizer without any improvement.  He is currently out of his inhaler.  He has not been running any fevers, sick contacts.  Patient's last admission was in August 2020 for acute respiratory distress.  He reports he does not feel similar to it although he reports that his shortness of breath is worsening.  He also has been taking some leftover steroids, antibiotics to help with his symptoms.  He also has yellow sputum with his cough.  He denies any fevers, chest pain, abdominal pain, COVID-19 exposures.  The history is provided by the patient.    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 Authorizing Provider  albuterol (PROVENTIL) (2.5 MG/3ML) 0.083% nebulizer solution Take 3 mLs (2.5 mg total) by nebulization every 6 (six) hours as needed for wheezing or shortness of breath. 06/23/19  Yes Lavina Hamman, MD  albuterol (VENTOLIN HFA) 108 (90 Base) MCG/ACT inhaler Inhale 2 puffs into the lungs every 4 (four) hours as needed for wheezing or shortness of breath. 06/23/19  Yes Lavina Hamman, MD  hydrOXYzine (ATARAX/VISTARIL) 25 MG tablet Take 1 tablet (25 mg total) by mouth 3 (three) times daily as needed for anxiety. 06/23/19  Yes Lavina Hamman, MD  ibuprofen (ADVIL,MOTRIN) 200 MG tablet Take 800 mg by mouth daily as needed for headache or mild pain.    Yes [provider]  ipratropium (ATROVENT) 0.02 % nebulizer solution Take 2.5 mLs (0.5 mg total) by nebulization every 6 (six) hours as needed for wheezing or shortness of breath. 06/23/19  Yes Lavina Hamman, MD  methadone (DOLOPHINE) 1 mg/ml oral solution Take 120 mg/kg by mouth daily.    Yes [provider]  sertraline (ZOLOFT) 50 MG tablet Take 50 mg by mouth daily.   Yes [provider]  benzonatate (TESSALON) 100 MG capsule Take 1 capsule (100 mg total) by mouth 2 (two) times daily. Patient not taking: Reported  on 08/31/2019 06/23/19   Lavina Hamman, MD  ibuprofen (ADVIL,MOTRIN) 600 MG tablet Take 1 tablet (600 mg total) by mouth every 8 (eight) hours as needed. Patient not taking: Reported on 12/30/2018 12/19/16   Clent Demark, PA-C  nicotine (NICODERM CQ - DOSED IN MG/24 HOURS) 21 mg/24hr patch Place 1 patch (21 mg total) onto the skin daily. Patient not taking: Reported on 08/31/2019 06/23/19   Lavina Hamman, MD  predniSONE (DELTASONE) 10 MG tablet Take 40mg  daily for 3days,Take 30mg  daily for 3days,Take 20mg  daily for 3days,Take 10mg  daily for 3days, then stop Patient not  taking: Reported on 08/31/2019 06/23/19   Lavina Hamman, MD    Family History No family history on file.  Social History Social History   Tobacco Use   Smoking status: Current Every Day Smoker    Packs/day: 0.50    Years: 5.00    Pack years: 2.50    Types: Cigarettes   Smokeless tobacco: Never Used  Substance Use Topics   Alcohol use: Not Currently    Comment: last used on 08/10/2012   Drug use: Not Currently    Types: Heroin, IV, Cocaine    Comment: on methadone program started ~ 03-2011       Allergies   Vicodin [hydrocodone-acetaminophen] and Xanax [alprazolam]   Review of Systems Review of Systems  Constitutional: Positive for chills. Negative for fever.  HENT: Negative for ear pain and sore throat.   Eyes: Negative for pain and visual disturbance.  Respiratory: Positive for shortness of breath and wheezing. Negative for cough.   Cardiovascular: Negative for chest pain, palpitations and leg swelling.  Gastrointestinal: Negative for abdominal pain and vomiting.  Genitourinary: Negative for dysuria and hematuria.  Musculoskeletal: Negative for arthralgias and back pain.  Skin: Negative for color change and rash.  Neurological: Negative for seizures and syncope.  All other systems reviewed and are negative.    Physical Exam Updated Vital Signs BP 135/69    Pulse 62    Temp 98 F (36.7 C) (Oral)    Resp 10    SpO2 95%   Physical Exam Vitals signs and nursing note reviewed.  Constitutional:      Appearance: He is well-developed.  HENT:     Head: Normocephalic and atraumatic.  Eyes:     General: No scleral icterus.    Pupils: Pupils are equal, round, and reactive to light.  Neck:     Musculoskeletal: Normal range of motion.  Cardiovascular:     Rate and Rhythm: Normal rate.     Heart sounds: Normal heart sounds.  Pulmonary:     Effort: Pulmonary effort is normal.     Breath sounds: Examination of the right-upper field reveals decreased breath sounds  and wheezing. Examination of the left-upper field reveals decreased breath sounds and wheezing. Examination of the right-middle field reveals decreased breath sounds and wheezing. Examination of the left-middle field reveals decreased breath sounds and wheezing. Examination of the right-lower field reveals decreased breath sounds and wheezing. Examination of the left-lower field reveals decreased breath sounds and wheezing. Decreased breath sounds and wheezing present.  Chest:     Chest wall: No tenderness.  Abdominal:     General: Bowel sounds are normal. There is no distension.     Palpations: Abdomen is soft.     Tenderness: There is no abdominal tenderness.  Musculoskeletal:        General: No tenderness or deformity.  Skin:    General: Skin  is warm and dry.  Neurological:     Mental Status: He is alert and oriented to person, place, and time.      ED Treatments / Results  Labs (all labs ordered are listed, but only abnormal results are displayed) Labs Reviewed  CBC WITH DIFFERENTIAL/PLATELET - Abnormal; Notable for the following components:      Result Value   Hemoglobin 12.0 (*)    HCT 37.2 (*)    All other components within normal limits  COMPREHENSIVE METABOLIC PANEL - Abnormal; Notable for the following components:   Glucose, Bld 109 (*)    Albumin 3.3 (*)    All other components within normal limits  SARS CORONAVIRUS 2 (TAT 6-24 HRS)    EKG None  Radiology Dg Chest Portable 1 View  Result Date: 08/31/2019 CLINICAL DATA:  Shortness of breath. EXAM: PORTABLE CHEST 1 VIEW COMPARISON:  06/21/2019. FINDINGS: Mediastinum and hilar structures normal. Heart size normal. No focal infiltrate. No pleural effusion or pneumothorax. Chest is stable from prior exam. IMPRESSION: No acute cardiopulmonary disease.  Stable chest from prior exam. Electronically Signed   By: Marcello Moores  Register   On: 08/31/2019 06:58    Procedures Procedures (including critical care time)  Medications  Ordered in ED Medications  albuterol (VENTOLIN HFA) 108 (90 Base) MCG/ACT inhaler 5 puff (has no administration in time range)  magnesium sulfate IVPB 2 g 50 mL (0 g Intravenous Stopped 08/31/19 1059)  predniSONE (DELTASONE) tablet 60 mg (60 mg Oral Given 08/31/19 0957)     Initial Impression / Assessment and Plan / ED Course  I have reviewed the triage vital signs and the nursing notes.  Pertinent labs & imaging results that were available during my care of the patient were reviewed by me and considered in my medical decision making (see chart for details).  Clinical Course as of Aug 30 1233  Mon Aug 31, 2019  1231 SpO2: 92 % [JS]    Clinical Course User Index [JS] Janeece Fitting, [place]      Patient with a past medical history of methadone dependence, asthma presents to the ED with complaint of shortness of breath x2 days.  He has been out of his inhaler, has been his nebulizer treatment without any improvement.  He also tried taking some of the leftover amoxicillin he had at home.  He arrived in the ED this morning, had to leave AMA due to being late for his methadone dosing appointment.  He returned today, satting at 92% on room air, he was previously admitted in August for acute hypoxia due to his asthma.  Suspect this is likely recurrent although he feels that his shortness of breath not as bad as it once was.   His chest xray this morning did not show any pneumonia or pleural effusion. Lungs are significantly diminished to auscultation, there is wheezing present, he has decubiti.  Will provide patient with mag and decadron to help with his symptoms.  CMP without any electrolyte normality, creatinine level is within normal limits.  His LFTs are unremarkable.  No anion gap.  CBC without any leukocytosis, hemoglobin slightly diminished but within his baseline.  His sats have been around the 90s, with the highest being 96% while sitting down.  After receiving magnesium along with Decadron  patient was reevaluated without much improvement in symptoms, there is decrease air movement in all lung fields.  Suspect the patient will need admission into the hospital in order for further management of his acute exacerbation  of asthma.  Covid 19 testing performed.  12:34 PM spoke to hospitalist service, they will admit patient in order to further manage his shortness of breath.  Patient has been educated on admission.  He is agreeable to staying.   2:04 PM patient refused hospitalist admission up in hospitals entering the room.  He now reports he just needs his medication and his inhaler in order to go home.  Patient became verbally aggressive when stating he is wanting his meds him to get out of here.  Advised patient that he needs to stay as he is not able to speak in full sentences, very high suspicion for respiratory decline upon leaving.  After talking to patient extensively for the past 20 minutes patient has agreed to stay.  I have now recontacted hospitalist Dr. Albertina Senegal who will admit patient at this time.   Portions of this note were generated with Lobbyist. Dictation errors may occur despite best attempts at proofreading.  Final Clinical Impressions(s) / ED Diagnoses   Final diagnoses:  Shortness of breath  Mild persistent asthma with exacerbation    ED Discharge Orders    None       Janeece Fitting, PA-C 08/31/19 1234    Janeece Fitting, PA-C 08/31/19 1405    Charlesetta Shanks, MD 09/02/19 1444

## 2019-08-31 NOTE — ED Provider Notes (Signed)
[place] DEPT Provider Note   CSN: [id number] Arrival date & time: 08/31/19  0502     History   Chief Complaint Chief Complaint  Patient presents with  . Shortness of Breath    HPI Aneil [name] is a 28 y.o. male.     28 y/o male with a PMH of Methadone dependence, Asthma presents to the ED with a chief complaint of shortness of breath x 2 days. Patient describes the sob is worse with ambulation, he reports using his nebulizer, 20 mg of prednisone yesterday, Mucinex, amoxicillin 4 doses without improvement in symptoms.  He also endorses a cough with yellow sputum.  He reports he has been out of his inhaler for the past several days.  His last admission is in May due to acute respiratory distress.  He denies any sick exposures, fever, chest pain.  Of note, he is currently enrolled in a methadone clinic, receives a daily dose of 120 mg of liquid methadone, reports he needs to have this medication given before 10 AM.  The history is provided by the patient and medical records.  Shortness of Breath Associated symptoms: cough and wheezing   Associated symptoms: no chest pain, no fever, no sore throat and no vomiting     Past Medical History:  Diagnosis Date  . Asthma   . Methadone dependence ([place])   . Tendonitis     Patient Active Problem List   Diagnosis Date Noted  . Asthma exacerbation 06/21/2019  . Closed fracture of lateral portion of right tibial plateau 12/04/2016  . Cellulitis of hand 08/12/2012  . Drug addiction ([place]) 09/11/2011  . 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 Asthma 03/28/2010    Past Surgical History:  Procedure Laterality Date  . INCISION AND DRAINAGE OF WOUND  08/12/2012   Procedure: IRRIGATION AND DEBRIDEMENT WOUND;  Surgeon: Tennis Must, MD;  Location: [place];  Service: Orthopedics;  Laterality: Right;  . WISDOM TOOTH EXTRACTION          Home Medications    Prior to Admission medications    Medication Sig Start Date End Date Taking? Authorizing Provider  albuterol (PROVENTIL) (2.5 MG/3ML) 0.083% nebulizer solution Take 3 mLs (2.5 mg total) by nebulization every 6 (six) hours as needed for wheezing or shortness of breath. 06/23/19   Lavina Hamman, MD  albuterol (VENTOLIN HFA) 108 (90 Base) MCG/ACT inhaler Inhale 2 puffs into the lungs every 4 (four) hours as needed for wheezing or shortness of breath. 06/23/19   Lavina Hamman, MD  benzonatate (TESSALON) 100 MG capsule Take 1 capsule (100 mg total) by mouth 2 (two) times daily. 06/23/19   Lavina Hamman, MD  hydrOXYzine (ATARAX/VISTARIL) 25 MG tablet Take 1 tablet (25 mg total) by mouth 3 (three) times daily as needed for anxiety. 06/23/19   Lavina Hamman, MD  ibuprofen (ADVIL,MOTRIN) 200 MG tablet Take 800 mg by mouth daily as needed for headache or mild pain.     [provider]  ibuprofen (ADVIL,MOTRIN) 600 MG tablet Take 1 tablet (600 mg total) by mouth every 8 (eight) hours as needed. Patient not taking: Reported on 12/30/2018 12/19/16   Clent Demark, PA-C  ipratropium (ATROVENT) 0.02 % nebulizer solution Take 2.5 mLs (0.5 mg total) by nebulization every 6 (six) hours as needed for wheezing or shortness of breath. 06/23/19   Lavina Hamman, MD  methadone (DOLOPHINE) 1 mg/ml oral solution Take 120 mg/kg by mouth daily.  [provider]  nicotine (NICODERM CQ - DOSED IN MG/24 HOURS) 21 mg/24hr patch Place 1 patch (21 mg total) onto the skin daily. 06/23/19   Lavina Hamman, MD  predniSONE (DELTASONE) 10 MG tablet Take 40mg  daily for 3days,Take 30mg  daily for 3days,Take 20mg  daily for 3days,Take 10mg  daily for 3days, then stop 06/23/19   Lavina Hamman, MD  sertraline (ZOLOFT) 50 MG tablet Take 50 mg by mouth daily.    [provider]    Family History No family history on file.  Social History Social History   Tobacco Use  . Smoking status: Current Every Day Smoker    Packs/day: 0.50    Years:  5.00    Pack years: 2.50    Types: Cigarettes  . Smokeless tobacco: Never Used  Substance Use Topics  . Alcohol use: Not Currently    Comment: last used on 08/10/2012  . Drug use: Not Currently    Types: Heroin, IV, Cocaine    Comment: on methadone program started ~ 03-2011       Allergies   Vicodin [hydrocodone-acetaminophen] and Xanax [alprazolam]   Review of Systems Review of Systems  Constitutional: Negative for fever.  HENT: Positive for rhinorrhea. Negative for sore throat.   Respiratory: Positive for cough, shortness of breath and wheezing.   Cardiovascular: Negative for chest pain.  Gastrointestinal: Negative for diarrhea and vomiting.  Genitourinary: Negative for flank pain.  Skin: Negative for pallor and wound.     Physical Exam Updated Vital Signs BP 117/74   Pulse (!) 59   Temp 97.9 F (36.6 C) (Oral)   Resp (!) 9   Ht 6\' 4"  (1.93 m)   Wt 88.7 kg   SpO2 97%   BMI 23.80 kg/m   Physical Exam Vitals signs and nursing note reviewed.  Constitutional:      Appearance: He is diaphoretic.     Comments: States he is detoxing.   HENT:     Head: Normocephalic and atraumatic.     Mouth/Throat:     Mouth: Mucous membranes are moist.     Pharynx: Oropharynx is clear.  Neck:     Musculoskeletal: Normal range of motion and neck supple.  Cardiovascular:     Rate and Rhythm: Normal rate.     Comments: No BL pitting edema.  Pulmonary:     Effort: Pulmonary effort is normal. Tachypnea present.     Breath sounds: No stridor. Examination of the right-upper field reveals decreased breath sounds and wheezing. Examination of the left-upper field reveals wheezing. Examination of the right-middle field reveals wheezing. Examination of the left-middle field reveals wheezing. Examination of the right-lower field reveals wheezing. Examination of the left-lower field reveals decreased breath sounds and wheezing. Decreased breath sounds and wheezing present.  Chest:     Chest  wall: No tenderness.  Musculoskeletal:     Right lower leg: No edema.     Left lower leg: No edema.  Skin:    General: Skin is warm.  Neurological:     Mental Status: He is alert and oriented to person, place, and time.      ED Treatments / Results  Labs (all labs ordered are listed, but only abnormal results are displayed) Labs Reviewed  CBC WITH DIFFERENTIAL/PLATELET  COMPREHENSIVE METABOLIC PANEL    EKG None  Radiology Dg Chest Portable 1 View  Result Date: 08/31/2019 CLINICAL DATA:  Shortness of breath. EXAM: PORTABLE CHEST 1 VIEW COMPARISON:  06/21/2019. FINDINGS: Mediastinum and hilar  structures normal. Heart size normal. No focal infiltrate. No pleural effusion or pneumothorax. Chest is stable from prior exam. IMPRESSION: No acute cardiopulmonary disease.  Stable chest from prior exam. Electronically Signed   By: Marcello Moores  Register   On: 08/31/2019 06:58    Procedures Procedures (including critical care time)  Medications Ordered in ED Medications - No data to display   Initial Impression / Assessment and Plan / ED Course  I have reviewed the triage vital signs and the nursing notes.  Pertinent labs & imaging results that were available during my care of the patient were reviewed by me and considered in my medical decision making (see chart for details).       Patient with a past medical history of methadone abuse, asthma presents to the ED with complaints of shortness of breath for the past 2 days.  He also endorses a productive cough with yellow sputum.  He reports been out of his albuterol inhaler for the past several days.  He does have a prior admission on June 21, 2019, this was due to respiratory distress.  Report he does not feel similar symptoms although he does endorse shortness of breath.  Of note, patient was very adamant that he needed to make an appointment within 3 hours at the methadone clinic to receive his daily dose of 120 mg of liquid  methadone.  I have called personally to [facility] clinic [facility] and spoke to pharmacist at [phone], confirmed patient does receive this medication on a daily basis between the hours of 530 and 6:30 AM.   8:08 AM patient did not allow me to complete work-up, he reports he needs to go get his methadone, I have advised patient he can go get his methadone the return to the emergency department.  He would likely need to sign out [place], patient has agreed to do so, reports he will return for further evaluation of his shortness of breath.     Portions of this note were generated with Lobbyist. Dictation errors may occur despite best attempts at proofreading.    Final Clinical Impressions(s) / ED Diagnoses   Final diagnoses:  Shortness of breath    ED Discharge Orders    None       Janeece Fitting, Hershal Coria 08/31/19 0809    Palumbo, April, MD 09/01/19 0018

## 2019-08-31 NOTE — Progress Notes (Signed)
Pt had poor effort peak flow attempted x4 the highest being 80.

## 2019-08-31 NOTE — ED Notes (Signed)
Patient giving self breathing treatment after writer ask him not to.

## 2019-09-01 DIAGNOSIS — J4531 Mild persistent asthma with (acute) exacerbation: Principal | ICD-10-CM

## 2019-09-01 DIAGNOSIS — F1920 Other psychoactive substance dependence, uncomplicated: Secondary | ICD-10-CM

## 2019-09-01 LAB — CBC
HCT: 38.6 % — ABNORMAL LOW (ref 39.0–52.0)
Hemoglobin: 12.4 g/dL — ABNORMAL LOW (ref 13.0–17.0)
MCH: 28.1 pg (ref 26.0–34.0)
MCHC: 32.1 g/dL (ref 30.0–36.0)
MCV: 87.3 fL (ref 80.0–100.0)
Platelets: 196 10*3/uL (ref 150–400)
RBC: 4.42 MIL/uL (ref 4.22–5.81)
RDW: 12.7 % (ref 11.5–15.5)
WBC: 6.6 10*3/uL (ref 4.0–10.5)
nRBC: 0 % (ref 0.0–0.2)

## 2019-09-01 LAB — BASIC METABOLIC PANEL
Anion gap: 9 (ref 5–15)
BUN: 13 mg/dL (ref 6–20)
CO2: 23 mmol/L (ref 22–32)
Calcium: 9.2 mg/dL (ref 8.9–10.3)
Chloride: 105 mmol/L (ref 98–111)
Creatinine, Ser: 0.69 mg/dL (ref 0.61–1.24)
GFR calc Af Amer: >60 mL/min (ref >60)
GFR calc non Af Amer: >60 mL/min (ref >60)
Glucose, Bld: 129 mg/dL — ABNORMAL HIGH (ref 70–99)
Potassium: 4 mmol/L (ref 3.5–5.1)
Sodium: 137 mmol/L (ref 135–145)

## 2019-09-01 MED ORDER — METHADONE HCL 10 MG/ML PO CONC
120.0000 mg | Freq: Every day | ORAL | Status: DC
  Administered 2019-09-01 – 2019-09-02 (×2): 120 mg via ORAL
  Filled 2019-09-01 (×2): qty 12

## 2019-09-01 MED ORDER — METHADONE HCL 10 MG/ML PO CONC: 120.0000 mg | Freq: Every day | ORAL | Status: DC

## 2019-09-01 MED ORDER — GUAIFENESIN 100 MG/5ML PO SOLN
5.0000 mL | ORAL | Status: DC
  Administered 2019-09-01 – 2019-09-02 (×7): 100 mg via ORAL
  Filled 2019-09-01 (×7): qty 10

## 2019-09-01 NOTE — Progress Notes (Signed)
PROGRESS NOTE  [name] [id number] DOB: 1991/05/21 DOA: 08/31/2019 PCP: Patient, No Pcp Per   LOS: 0 days   Brief Narrative / Interim history: 28 year old male with history of asthma, methadone dependence, prior history of drug use, came to the hospital and was admitted on 08/31/2019 due to shortness of breath, cough, that started the day prior to admission.  He feels like he got exposed to some chemicals at work with strong smells and that is what triggered his asthma.  He denies any fever or chills, had no sore throat on admission but is having a little bit this morning.    Subjective / 24h Interval events: Continues to feel short of breath, does complain of a cough and feels like there is a lot of "rattling" in his chest and wants to expectorate but cannot  Assessment & Plan: Active Problems:   Asthma   Drug addiction ([place])   Asthma exacerbation   Principal Problem Acute asthma exacerbation -Patient with persistent symptoms this morning, significant wheezing on exam as well as shortness of breath.  He was started on steroids, nebulizers (his Covid was negative), continue. -Requires aggressive inpatient care due to persistent symptoms today, continue current regimen with IV Solu-Medrol, scheduled nebulizers -Added mucolytic's, incentive spirometry, flutter valve  Active Problems History of drug addiction -On chronic methadone, continue  Scheduled Meds: . enoxaparin (LOVENOX) injection  40 mg Subcutaneous Q24H  . guaiFENesin  5 mL Oral Q4H  . ipratropium-albuterol  3 mL Nebulization Q6H  . methadone  120 mg Oral Daily  . methylPREDNISolone (SOLU-MEDROL) injection  60 mg Intravenous Q12H  . nicotine  21 mg Transdermal Daily  . sertraline  50 mg Oral Daily   Continuous Infusions: PRN Meds:.hydrOXYzine, ondansetron **OR** ondansetron (ZOFRAN) IV, senna-docusate  DVT prophylaxis: lovenox Code Status: Full code Family Communication: d/w patient  Disposition Plan:  home when improved  Consultants:  None   Procedures:  None   Microbiology  None   Antimicrobials: None     Objective: Vitals:   08/31/19 2113 09/01/19 0246 09/01/19 0521 09/01/19 0758  BP:   119/64   Pulse:   60   Resp:   16   Temp:   98.2 F (36.8 C)   TempSrc:      SpO2: 95% 93% 94% 97%    Intake/Output Summary (Last 24 hours) at 09/01/2019 1035 Last data filed at 08/31/2019 1059 Gross per 24 hour  Intake 50 ml  Output -  Net 50 ml   There were no vitals filed for this visit.  Examination:  Constitutional: No significant distress, appears tachypneic Eyes: PERRL, lids and conjunctivae normal ENMT: Mucous membranes are moist Respiratory: Diffuse bilateral wheezing with coarse breath sounds throughout entire lung fields, slightly increased respiratory effort Cardiovascular: Regular rate and rhythm, no murmurs / rubs / gallops. No LE edema. Good peripheral pulses Abdomen: no tenderness. Bowel sounds positive.  Musculoskeletal: no clubbing / cyanosis.  Skin: no rashes Neurologic: CN 2-12 grossly intact. Strength 5/5 in all 4.  Psychiatric: Normal judgment and insight. Alert and oriented x 3. Normal mood.    Data Reviewed: I have independently reviewed following labs and imaging studies   CBC: Recent Labs  Lab 08/31/19 1135 09/01/19 0529  WBC 4.7 6.6  NEUTROABS 2.9  --   HGB 12.0* 12.4*  HCT 37.2* 38.6*  MCV 86.1 87.3  PLT 182 [id number]   Basic Metabolic Panel: Recent Labs  Lab 08/31/19 1018 08/31/19 1135 09/01/19 0529  NA 140 141 137  K 3.9 4.2 4.0  CL 106 107 105  CO2 23 26 23   GLUCOSE 119* 109* 129*  BUN 13 13 13   CREATININE 0.78 0.77 0.69  CALCIUM 9.0 9.0 9.2   GFR: Estimated Creatinine Clearance: 165.8 mL/min (by C-G formula based on SCr of 0.69 mg/dL). Liver Function Tests: Recent Labs  Lab 08/31/19 1018 08/31/19 1135  AST 16 15  ALT 14 13  ALKPHOS 77 80  BILITOT 0.4 0.5  PROT 7.0 7.2  ALBUMIN 3.3* 3.3*   No results for input(s):  LIPASE, AMYLASE in the last 168 hours. No results for input(s): AMMONIA in the last 168 hours. Coagulation Profile: No results for input(s): INR, PROTIME in the last 168 hours. Cardiac Enzymes: No results for input(s): CKTOTAL, CKMB, CKMBINDEX, TROPONINI in the last 168 hours. BNP (last 3 results) No results for input(s): PROBNP in the last 8760 hours. HbA1C: No results for input(s): HGBA1C in the last 72 hours. CBG: No results for input(s): GLUCAP in the last 168 hours. Lipid Profile: No results for input(s): CHOL, HDL, LDLCALC, TRIG, CHOLHDL, LDLDIRECT in the last 72 hours. Thyroid Function Tests: No results for input(s): TSH, T4TOTAL, FREET4, T3FREE, THYROIDAB in the last 72 hours. Anemia Panel: No results for input(s): VITAMINB12, FOLATE, FERRITIN, TIBC, IRON, RETICCTPCT in the last 72 hours. Urine analysis: No results found for: COLORURINE, APPEARANCEUR, LABSPEC, PHURINE, GLUCOSEU, HGBUR, BILIRUBINUR, KETONESUR, PROTEINUR, UROBILINOGEN, NITRITE, LEUKOCYTESUR Sepsis Labs: Invalid input(s): PROCALCITONIN, LACTICIDVEN  Recent Results (from the past 240 hour(s))  SARS CORONAVIRUS 2 (TAT 6-24 HRS) Nasopharyngeal Nasopharyngeal Swab     Status: None   Collection Time: 08/31/19 11:59 AM   Specimen: Nasopharyngeal Swab  Result Value Ref Range Status   SARS Coronavirus 2 NEGATIVE NEGATIVE Final    Comment: (NOTE) SARS-CoV-2 target nucleic acids are NOT DETECTED. The SARS-CoV-2 RNA is generally detectable in upper and lower respiratory specimens during the acute phase of infection. Negative results do not preclude SARS-CoV-2 infection, do not rule out co-infections with other pathogens, and should not be used as the sole basis for treatment or other patient management decisions. Negative results must be combined with clinical observations, patient history, and epidemiological information. The expected result is Negative. Fact Sheet for Patients:  SugarRoll.be Fact Sheet for Healthcare Providers: https://www.woods-mathews.com/ This test is not yet approved or cleared by the [place] FDA and  has been authorized for detection and/or diagnosis of SARS-CoV-2 by FDA under an Emergency Use Authorization (EUA). This EUA will remain  in effect (meaning this test can be used) for the duration of the COVID-19 declaration under Section 56 4(b)(1) of the Act, 21 U.S.C. section 360bbb-3(b)(1), unless the authorization is terminated or revoked sooner. Performed at [facility], [place] [address], [place], [place] [address]       Radiology Studies: Dg Chest Portable 1 View  Result Date: 08/31/2019 CLINICAL DATA:  Shortness of breath. EXAM: PORTABLE CHEST 1 VIEW COMPARISON:  06/21/2019. FINDINGS: Mediastinum and hilar structures normal. Heart size normal. No focal infiltrate. No pleural effusion or pneumothorax. Chest is stable from prior exam. IMPRESSION: No acute cardiopulmonary disease.  Stable chest from prior exam. Electronically Signed   By: Marcello Moores  Register   On: 08/31/2019 06:58    Marzetta Board, MD, PhD [facility]  Contact via  [email or web address]  [place] P: [phone] F: [phone]

## 2019-09-02 MED ORDER — GUAIFENESIN 100 MG/5ML PO SOLN: 5.0000 mL | ORAL | 0 refills | Status: AC

## 2019-09-02 MED ORDER — ALBUTEROL SULFATE HFA 108 (90 BASE) MCG/ACT IN AERS
1.0000 | INHALATION_SPRAY | RESPIRATORY_TRACT | Status: DC
  Filled 2019-09-02: qty 6.7

## 2019-09-02 MED ORDER — ALBUTEROL SULFATE HFA 108 (90 BASE) MCG/ACT IN AERS: 2.0000 | INHALATION_SPRAY | Freq: Four times a day (QID) | RESPIRATORY_TRACT | 0 refills | Status: DC | PRN

## 2019-09-02 MED ORDER — PREDNISONE 10 MG PO TABS: ORAL_TABLET | ORAL | 0 refills | Status: AC

## 2019-09-02 MED ORDER — ALBUTEROL SULFATE (2.5 MG/3ML) 0.083% IN NEBU: 3.0000 mL | INHALATION_SOLUTION | RESPIRATORY_TRACT | Status: DC

## 2019-09-02 MED ORDER — ALBUTEROL SULFATE HFA 108 (90 BASE) MCG/ACT IN AERS: 1.0000 | INHALATION_SPRAY | RESPIRATORY_TRACT | 0 refills | Status: DC

## 2019-09-02 MED FILL — predniSONE 10 MG TABS: 10 | 8 days supply | Qty: 26 | Fill #0

## 2019-09-02 MED FILL — !VENTOLIN HFA INHALER: 108 (90 BAS | 25 days supply | Qty: 18 | Fill #0

## 2019-09-02 NOTE — Discharge Summary (Addendum)
Physician Discharge Summary  [name] [id number] DOB: 26-Jul-1991 DOA: 08/31/2019  PCP: Patient, No Pcp Per  Admit date: 08/31/2019 Discharge date: 09/02/2019  Admitted From: Home Disposition: Home  Recommendations for Outpatient Follow-up:  1. Follow up with PCP in 1-2 weeks 2. Please obtain BMP/CBC in one week 3. Please follow up on the following pending results:  Home Health: No Equipment/Devices: None Discharge Condition: Stable CODE STATUS: Full Diet recommendation: Regular  Brief/Interim Summary: 28 year old male with history of asthma, methadone dependence, prior history of drug use, came to the hospital and was admitted on 08/31/2019 due to shortness of breath, cough, that started the day prior to admission. He was treated for asthma exacerbation.  There was some concern about exposure to some chemicals at work which triggered his asthma.  COVID-19 test was negative. He was treated with steroid and DuoNeb.  Responded well.  He continued to have mild wheezing and would like to go home on a tapering dose of prednisone.  Discharge Diagnoses:  Active Problems:   Asthma   Drug addiction ([place])   Asthma exacerbation   Discharge Instructions  Discharge Instructions    Diet - low sodium heart healthy   Complete by: As directed    Discharge instructions   Complete by: As directed    It was pleasure taking care of you. Please use albuterol inhaler as needed for your wheezing.  You might need every 4-6 hours for few days. I am giving you a tapering dose of prednisone please take it as directed. Please follow-up with your PCP within 1 week.   Increase activity slowly   Complete by: As directed      Allergies as of 09/02/2019      Reactions   Vicodin [hydrocodone-acetaminophen] Other (See Comments)   AVOID PAIN KILLERS OR ANY OTHER MEDS THAT MAY CAUSE ADDICTION   Xanax [alprazolam] Other (See Comments)   AVOID BENZOS OR ANY OTHER MEDS THAT MAY CAUSE ADDICTION       Medication List    STOP taking these medications   benzonatate 100 MG capsule Commonly known as: TESSALON   ibuprofen 200 MG tablet Commonly known as: ADVIL   ibuprofen 600 MG tablet Commonly known as: ADVIL     TAKE these medications   albuterol (2.5 MG/3ML) 0.083% nebulizer solution Commonly known as: PROVENTIL Take 3 mLs (2.5 mg total) by nebulization every 6 (six) hours as needed for wheezing or shortness of breath. What changed:   Another medication with the same name was added. Make sure you understand how and when to take each.  Another medication with the same name was changed. Make sure you understand how and when to take each.   albuterol 108 (90 Base) MCG/ACT inhaler Commonly known as: VENTOLIN HFA Inhale 2 puffs into the lungs every 6 (six) hours as needed for wheezing or shortness of breath. What changed: when to take this   albuterol 108 (90 Base) MCG/ACT inhaler Commonly known as: VENTOLIN HFA Inhale 1 puff into the lungs every 4 (four) hours. What changed: You were already taking a medication with the same name, and this prescription was added. Make sure you understand how and when to take each.   guaiFENesin 100 MG/5ML Soln Commonly known as: ROBITUSSIN Take 5 mLs (100 mg total) by mouth every 4 (four) hours.   hydrOXYzine 25 MG tablet Commonly known as: ATARAX/VISTARIL Take 1 tablet (25 mg total) by mouth 3 (three) times daily as needed for anxiety.   ipratropium 0.02 %  nebulizer solution Commonly known as: ATROVENT Take 2.5 mLs (0.5 mg total) by nebulization every 6 (six) hours as needed for wheezing or shortness of breath.   methadone 1 mg/ml  oral solution Commonly known as: DOLOPHINE Take 120 mg/kg by mouth daily.   nicotine 21 mg/24hr patch Commonly known as: NICODERM CQ - dosed in mg/24 hours Place 1 patch (21 mg total) onto the skin daily.   predniSONE 10 MG tablet Commonly known as: DELTASONE Take 4 tablets (40 mg total) by mouth  daily for 4 days, THEN 3 tablets (30 mg total) daily for 2 days, THEN 2 tablets (20 mg total) daily for 2 days, THEN 1 tablet (10 mg total) daily for 2 days. 3 tablets / 30 mg for another 2 days, then decrease to 2 tablets / 20 mg for another 2 days, then take 1 tablet / 10 mg for 2 days, stop taking after that.. Start taking on: September 02, 2019 What changed: See the new instructions.   sertraline 50 MG tablet Commonly known as: ZOLOFT Take 50 mg by mouth daily.       Allergies  Allergen Reactions  . Vicodin [Hydrocodone-Acetaminophen] Other (See Comments)    AVOID PAIN KILLERS OR ANY OTHER MEDS THAT MAY CAUSE ADDICTION  . Xanax [Alprazolam] Other (See Comments)    AVOID BENZOS OR ANY OTHER MEDS THAT MAY CAUSE ADDICTION    Procedures/Studies: Dg Chest Portable 1 View  Result Date: 08/31/2019 CLINICAL DATA:  Shortness of breath. EXAM: PORTABLE CHEST 1 VIEW COMPARISON:  06/21/2019. FINDINGS: Mediastinum and hilar structures normal. Heart size normal. No focal infiltrate. No pleural effusion or pneumothorax. Chest is stable from prior exam. IMPRESSION: No acute cardiopulmonary disease.  Stable chest from prior exam. Electronically Signed   By: Marcello Moores  Register   On: 08/31/2019 06:58    Subjective: Patient was feeling better when seen this morning.  Denies any more shortness of breath.  Would like to go home on tapering dose of steroid stating that tapering dose always worked better for him.  Discharge Exam: Vitals:   09/02/19 0519 09/02/19 0808  BP: 122/69   Pulse: (!) 51   Resp: 16   Temp: 98 F (36.7 C)   SpO2: 100% 97%   Vitals:   09/01/19 2110 09/02/19 0128 09/02/19 0519 09/02/19 0808  BP: 125/65  122/69   Pulse: 68  (!) 51   Resp: 16  16   Temp: 98.1 F (36.7 C)  98 F (36.7 C)   TempSrc: Oral  Oral   SpO2: 100% 98% 100% 97%    General: Pt is alert, awake, not in acute distress Cardiovascular: RRR, S1/S2 +, no rubs, no gallops Respiratory: Few scattered  expiratory wheeze bilaterally. Abdominal: Soft, NT, ND, bowel sounds + Extremities: no edema, no cyanosis   The results of significant diagnostics from this hospitalization (including imaging, microbiology, ancillary and laboratory) are listed below for reference.     Microbiology: Recent Results (from the past 240 hour(s))  SARS CORONAVIRUS 2 (TAT 6-24 HRS) Nasopharyngeal Nasopharyngeal Swab     Status: None   Collection Time: 08/31/19 11:59 AM   Specimen: Nasopharyngeal Swab  Result Value Ref Range Status   SARS Coronavirus 2 NEGATIVE NEGATIVE Final    Comment: (NOTE) SARS-CoV-2 target nucleic acids are NOT DETECTED. The SARS-CoV-2 RNA is generally detectable in upper and lower respiratory specimens during the acute phase of infection. Negative results do not preclude SARS-CoV-2 infection, do not rule out co-infections with other pathogens, and  should not be used as the sole basis for treatment or other patient management decisions. Negative results must be combined with clinical observations, patient history, and epidemiological information. The expected result is Negative. Fact Sheet for Patients: SugarRoll.be Fact Sheet for Healthcare Providers: https://www.woods-mathews.com/ This test is not yet approved or cleared by the [place] FDA and  has been authorized for detection and/or diagnosis of SARS-CoV-2 by FDA under an Emergency Use Authorization (EUA). This EUA will remain  in effect (meaning this test can be used) for the duration of the COVID-19 declaration under Section 56 4(b)(1) of the Act, 21 U.S.C. section 360bbb-3(b)(1), unless the authorization is terminated or revoked sooner. Performed at [facility], [place] [address] [place], [place] [address]      Labs: BNP (last 3 results) No results for input(s): BNP in the last 8760 hours. Basic Metabolic Panel: Recent Labs  Lab 08/31/19 1018 08/31/19 1135  09/01/19 0529  NA 140 141 137  K 3.9 4.2 4.0  CL 106 107 105  CO2 23 26 23   GLUCOSE 119* 109* 129*  BUN 13 13 13   CREATININE 0.78 0.77 0.69  CALCIUM 9.0 9.0 9.2   Liver Function Tests: Recent Labs  Lab 08/31/19 1018 08/31/19 1135  AST 16 15  ALT 14 13  ALKPHOS 77 80  BILITOT 0.4 0.5  PROT 7.0 7.2  ALBUMIN 3.3* 3.3*   No results for input(s): LIPASE, AMYLASE in the last 168 hours. No results for input(s): AMMONIA in the last 168 hours. CBC: Recent Labs  Lab 08/31/19 1135 09/01/19 0529  WBC 4.7 6.6  NEUTROABS 2.9  --   HGB 12.0* 12.4*  HCT 37.2* 38.6*  MCV 86.1 87.3  PLT 182 196   Cardiac Enzymes: No results for input(s): CKTOTAL, CKMB, CKMBINDEX, TROPONINI in the last 168 hours. BNP: Invalid input(s): POCBNP CBG: No results for input(s): GLUCAP in the last 168 hours. D-Dimer No results for input(s): DDIMER in the last 72 hours. Hgb A1c No results for input(s): HGBA1C in the last 72 hours. Lipid Profile No results for input(s): CHOL, HDL, LDLCALC, TRIG, CHOLHDL, LDLDIRECT in the last 72 hours. Thyroid function studies No results for input(s): TSH, T4TOTAL, T3FREE, THYROIDAB in the last 72 hours.  Invalid input(s): FREET3 Anemia work up No results for input(s): VITAMINB12, FOLATE, FERRITIN, TIBC, IRON, RETICCTPCT in the last 72 hours. Urinalysis No results found for: COLORURINE, APPEARANCEUR, [place], [place], [place], [place], [place], [place], PROTEINUR, UROBILINOGEN, NITRITE, LEUKOCYTESUR Sepsis Labs Invalid input(s): PROCALCITONIN,  WBC,  LACTICIDVEN Microbiology Recent Results (from the past 240 hour(s))  SARS CORONAVIRUS 2 (TAT 6-24 HRS) Nasopharyngeal Nasopharyngeal Swab     Status: None   Collection Time: 08/31/19 11:59 AM   Specimen: Nasopharyngeal Swab  Result Value Ref Range Status   SARS Coronavirus 2 NEGATIVE NEGATIVE Final    Comment: (NOTE) SARS-CoV-2 target nucleic acids are NOT DETECTED. The SARS-CoV-2 RNA is generally detectable  in upper and lower respiratory specimens during the acute phase of infection. Negative results do not preclude SARS-CoV-2 infection, do not rule out co-infections with other pathogens, and should not be used as the sole basis for treatment or other patient management decisions. Negative results must be combined with clinical observations, patient history, and epidemiological information. The expected result is Negative. Fact Sheet for Patients: SugarRoll.be Fact Sheet for Healthcare Providers: https://www.woods-mathews.com/ This test is not yet approved or cleared by the [place] FDA and  has been authorized for detection and/or diagnosis of SARS-CoV-2 by FDA  under an Emergency Use Authorization (EUA). This EUA will remain  in effect (meaning this test can be used) for the duration of the COVID-19 declaration under Section 56 4(b)(1) of the Act, 21 U.S.C. section 360bbb-3(b)(1), unless the authorization is terminated or revoked sooner. Performed at [facility], [place] [address], [place], [place] [address]     Time coordinating discharge: Over 30 minutes  SIGNED:  Lorella Nimrod, MD  [facility] 09/02/2019, 12:49 PM Pager [phone]  If 7PM-7AM, please contact night-coverage [email or web address] Password [email or web address]

## 2019-09-02 NOTE — Progress Notes (Signed)
Pts IV removed with a clean and dry dressing intact. Pt denies pain at the time of d/c with no s/s of distress noted. Pt given a neb treatment per [facility] before d/c.This RN educated pt on d/c instructions, follow ups, and medications, with all patient questions answered at the time.

## 2020-01-13 ENCOUNTER — Other Ambulatory Visit: Payer: Self-pay

## 2020-01-13 ENCOUNTER — Emergency Department (HOSPITAL_BASED_OUTPATIENT_CLINIC_OR_DEPARTMENT_OTHER): Payer: 59

## 2020-01-13 ENCOUNTER — Encounter (HOSPITAL_BASED_OUTPATIENT_CLINIC_OR_DEPARTMENT_OTHER): Payer: Self-pay | Admitting: Emergency Medicine

## 2020-01-13 ENCOUNTER — Emergency Department (HOSPITAL_BASED_OUTPATIENT_CLINIC_OR_DEPARTMENT_OTHER)
Admission: EM | Admit: 2020-01-13 | Discharge: 2020-01-13 | Disposition: A | Discharge status: Home / Self Care | Payer: 59 | Attending: Emergency Medicine | Admitting: Emergency Medicine

## 2020-01-13 DIAGNOSIS — Z79899 Other long term (current) drug therapy: Secondary | ICD-10-CM | POA: Diagnosis not present

## 2020-01-13 DIAGNOSIS — J45901 Unspecified asthma with (acute) exacerbation: Secondary | ICD-10-CM | POA: Insufficient documentation

## 2020-01-13 DIAGNOSIS — F17210 Nicotine dependence, cigarettes, uncomplicated: Secondary | ICD-10-CM | POA: Insufficient documentation

## 2020-01-13 DIAGNOSIS — L03114 Cellulitis of left upper limb: Secondary | ICD-10-CM | POA: Diagnosis not present

## 2020-01-13 DIAGNOSIS — R0602 Shortness of breath: Secondary | ICD-10-CM | POA: Insufficient documentation

## 2020-01-13 DIAGNOSIS — R110 Nausea: Secondary | ICD-10-CM | POA: Diagnosis not present

## 2020-01-13 DIAGNOSIS — R531 Weakness: Secondary | ICD-10-CM | POA: Diagnosis present

## 2020-01-13 MED ORDER — DOXYCYCLINE HYCLATE 100 MG PO CAPS: 100.0000 mg | ORAL_CAPSULE | Freq: Two times a day (BID) | ORAL | 0 refills | Status: AC

## 2020-01-13 MED ORDER — ALBUTEROL SULFATE HFA 108 (90 BASE) MCG/ACT IN AERS
4.0000 | INHALATION_SPRAY | Freq: Once | RESPIRATORY_TRACT | Status: AC
  Administered 2020-01-13: 4 via RESPIRATORY_TRACT
  Filled 2020-01-13: qty 6.7

## 2020-01-13 MED ORDER — PREDNISONE 50 MG PO TABS
60.0000 mg | ORAL_TABLET | Freq: Once | ORAL | Status: AC
  Administered 2020-01-13: 60 mg via ORAL
  Filled 2020-01-13: qty 1

## 2020-01-13 MED FILL — DOXYCYCLINE HYCLATE 100 MG: 100 | 10 days supply | Qty: 20 | Fill #0

## 2020-01-13 NOTE — ED Provider Notes (Signed)
[facility] EMERGENCY DEPARTMENT Provider Note   CSN: [id number] Arrival date & time: 01/13/20  [id number]     History Chief Complaint  Patient presents with  . Shortness of Breath  . Weakness    [name] is a 29 y.o. male.  Presents to ER with chief complaint generalized weakness, shortness of breath.  Symptoms ongoing for the last week, worse over the last 3 days.  States he struggles with asthma, feels similar to prior asthma flareups.  Has had poor appetite but still eating and drinking daily.  Had some nausea, no vomiting.  No fevers.  No chest pain.  Patient initially denied any rashes, later endorsed having infection over his left forearm.  States this has been ongoing since last summer.  Has not had any recent changes.  Wants antibiotic, but no other treatment done.  HPI     Past Medical History:  Diagnosis Date  . Asthma   . 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 Tendonitis     Patient Active Problem List   Diagnosis Date Noted  . Asthma exacerbation 06/21/2019  . Closed fracture of lateral portion of right tibial plateau 12/04/2016  . Cellulitis of hand 08/12/2012  . Drug addiction ([place]) 09/11/2011  . 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 Asthma 03/28/2010    Past Surgical History:  Procedure Laterality Date  . INCISION AND DRAINAGE OF WOUND  08/12/2012   Procedure: IRRIGATION AND DEBRIDEMENT WOUND;  Surgeon: Tennis Must, MD;  Location: [facility];  Service: Orthopedics;  Laterality: Right;  . WISDOM TOOTH EXTRACTION         History reviewed. No pertinent family history.  Social History   Tobacco Use  . Smoking status: Current Every Day Smoker    Packs/day: 0.50    Years: 5.00    Pack years: 2.50    Types: Cigarettes  . Smokeless tobacco: Never Used  Substance Use Topics  . Alcohol use: Not Currently    Comment: last used on 08/10/2012  . Drug use: Not Currently    Types: Heroin, IV, Cocaine    Comment: on methadone program started ~  03-2011      Home Medications Prior to Admission medications   Medication Sig Start Date End Date Taking? Authorizing Provider  albuterol (PROVENTIL) (2.5 MG/3ML) 0.083% nebulizer solution Take 3 mLs (2.5 mg total) by nebulization every 6 (six) hours as needed for wheezing or shortness of breath. 06/23/19   Lavina Hamman, MD  albuterol (VENTOLIN HFA) 108 (90 Base) MCG/ACT inhaler Inhale 2 puffs into the lungs every 6 (six) hours as needed for wheezing or shortness of breath. 09/02/19   Lorella Nimrod, MD  albuterol (VENTOLIN HFA) 108 (90 Base) MCG/ACT inhaler Inhale 1 puff into the lungs every 4 (four) hours. 09/02/19   Lorella Nimrod, MD  doxycycline (VIBRAMYCIN) 100 MG capsule Take 1 capsule (100 mg total) by mouth 2 (two) times daily for 10 days. 01/13/20 01/23/20  Lucrezia Starch, MD  guaiFENesin (ROBITUSSIN) 100 MG/5ML SOLN Take 5 mLs (100 mg total) by mouth every 4 (four) hours. 09/02/19   Lorella Nimrod, MD  hydrOXYzine (ATARAX/VISTARIL) 25 MG tablet Take 1 tablet (25 mg total) by mouth 3 (three) times daily as needed for anxiety. 06/23/19   Lavina Hamman, MD  ipratropium (ATROVENT) 0.02 % nebulizer solution Take 2.5 mLs (0.5 mg total) by nebulization every 6 (six) hours as needed for wheezing or shortness of breath. 06/23/19   Lavina Hamman, MD  methadone (DOLOPHINE) 1 mg/ml oral solution Take 120 mg/kg by mouth daily.     [provider]  nicotine (NICODERM CQ - DOSED IN MG/24 HOURS) 21 mg/24hr patch Place 1 patch (21 mg total) onto the skin daily. Patient not taking: Reported on 08/31/2019 06/23/19   Lavina Hamman, MD  sertraline (ZOLOFT) 50 MG tablet Take 50 mg by mouth daily.    [provider]    Allergies    Vicodin [hydrocodone-acetaminophen] and Xanax [alprazolam]  Review of Systems   Review of Systems  Constitutional: Positive for fatigue. Negative for chills and fever.  HENT: Negative for ear pain and sore throat.   Eyes: Negative for pain and visual  disturbance.  Respiratory: Positive for shortness of breath. Negative for cough.   Cardiovascular: Negative for chest pain and palpitations.  Gastrointestinal: Negative for abdominal pain and vomiting.  Genitourinary: Negative for dysuria and hematuria.  Musculoskeletal: Negative for arthralgias and back pain.  Skin: Positive for color change and rash.  Neurological: Negative for seizures and syncope.  All other systems reviewed and are negative.   Physical Exam Updated Vital Signs BP (!) 113/58   Pulse 85   Temp 98.6 F (37 C) (Oral)   Resp 13   SpO2 97%   Physical Exam Vitals and nursing note reviewed.  Constitutional:      Appearance: He is well-developed.  HENT:     Head: Normocephalic and atraumatic.  Eyes:     Conjunctiva/sclera: Conjunctivae normal.  Cardiovascular:     Rate and Rhythm: Normal rate and regular rhythm.     Heart sounds: No murmur.  Pulmonary:     Effort: Pulmonary effort is normal. No respiratory distress.     Comments: Mild end expiratory wheeze bilateral, no crackles, no respiratory distress, no accessory muscle movement, speaking full sentences Abdominal:     Palpations: Abdomen is soft.     Tenderness: There is no abdominal tenderness.  Musculoskeletal:     Cervical back: Neck supple.  Skin:    Comments: Left forearm: Dorsal left forearm has multiple superficial ulcerations with overlying erythema, no active purulent drainage, no discrete abscess palpated, normal elbow and wrist range of motion, distal pulses, cap refill, sensation intact  Neurological:     General: No focal deficit present.     Mental Status: He is alert and oriented to person, place, and time.     ED Results / Procedures / Treatments   Labs (all labs ordered are listed, but only abnormal results are displayed) Labs Reviewed - No data to display  EKG EKG Interpretation  Date/Time:  Wednesday January 13 2020 09:36:13 EDT Ventricular Rate:  109 PR Interval:    QRS  Duration: 105 QT Interval:  326 QTC Calculation: 439 R Axis:   92 Text Interpretation: Sinus tachycardia Borderline right axis deviation Anterolateral Q wave, probably normal for age Borderline T wave abnormalities No acute changes Confirmed by Madalyn Rob [phone]) on 01/13/2020 10:32:43 AM   Radiology DG Chest Port 1 View  Result Date: 01/13/2020 CLINICAL DATA:  Shortness of breath and weakness for 1 week. EXAM: PORTABLE CHEST 1 VIEW COMPARISON:  08/31/2019 FINDINGS: The heart size and mediastinal contours are within normal limits. Both lungs are clear. The visualized skeletal structures are unremarkable. IMPRESSION: Normal examination. Electronically Signed   By: Claudie Revering M.D.   On: 01/13/2020 11:18    Procedures Procedures (including critical care time)  Medications Ordered in ED Medications  albuterol (VENTOLIN HFA) 108 (90 Base)  MCG/ACT inhaler 4 puff (4 puffs Inhalation Given 01/13/20 1101)  predniSONE (DELTASONE) tablet 60 mg (60 mg Oral Given 01/13/20 1059)    ED Course  I have reviewed the triage vital signs and the nursing notes.  Pertinent labs & imaging results that were available during my care of the patient were reviewed by me and considered in my medical decision making (see chart for details).    MDM Rules/Calculators/A&P                      29 year old male presents to ER with chief complaint weakness, shortness of breath.  On exam noted expiratory wheeze.  Suspect related to his underlying asthma.  Improved after given inhaler, steroids.  No respiratory distress, no hypoxia, well-appearing.  Believe appropriate for outpatient management at this time.  Recommend recheck with primary doctor.  Patient also endorsed having a chronic rash on his left forearm.  This appears to be somewhat chronic in nature but likely has cellulitic component.  Suspect related to picking behavior.  Instructed patient on wound care, recommended trial of antibiotics and recommended  close follow-up with his primary doctor regarding this issue as well.  Gave Rx for doxycycline.    After the discussed management above, the patient was determined to be safe for discharge.  The patient was in agreement with this plan and all questions regarding their care were answered.  ED return precautions were discussed and the patient will return to the ED with any significant worsening of condition.   Final Clinical Impression(s) / ED Diagnoses Final diagnoses:  Cellulitis of forearm, left  Asthma with acute exacerbation, unspecified asthma severity, unspecified whether persistent    Rx / DC Orders ED Discharge Orders         Ordered    doxycycline (VIBRAMYCIN) 100 MG capsule  2 times daily     01/13/20 1140           Lucrezia Starch, MD 01/13/20 1546

## 2020-01-13 NOTE — Discharge Instructions (Signed)
Recommend taking the antibiotic as prescribed for your forearm infection.  Recommend taking the steroid course for your asthma flareup.  Use the asthma inhaler at home with spacer as needed for additional wheezing.  If the area on your arm gets more swollen, worsening redness or drainage, return to ER for reassessment.  If your breathing gets worse, you develop chest pain or other new concerning symptom recommend return to ER as well.  Please follow-up with a primary care doctor.  If you do not have one, you can call community health and wellness in the interim.

## 2020-01-13 NOTE — ED Triage Notes (Signed)
Pt here with SOB and weakness x 1 week. N/V was present 3 days ago, but no longer has it.

## 2020-03-30 ENCOUNTER — Ambulatory Visit (HOSPITAL_COMMUNITY)
Admission: RE | Admit: 2020-03-30 | Discharge: 2020-03-30 | Disposition: A | Discharge status: Home / Self Care | Payer: 59 | Attending: Psychiatry | Admitting: Psychiatry

## 2020-03-30 DIAGNOSIS — F1124 Opioid dependence with opioid-induced mood disorder: Secondary | ICD-10-CM | POA: Insufficient documentation

## 2020-03-30 NOTE — BH Assessment (Signed)
Assessment Note  [name] is an 29 y.o. male with history of Methadone Dependence. He presents to [facility] seeking a Methadone taper. Patient currently receiving services at "[facility]) x18 months. He states, "The company has new management and I don't like it".  Patient complains that they are taking to long to taper him off the drug and wants the process to move faster. He is currently prescribed 115 mg. Clinician asked patient if he has discussed his concerns with staff at the facility. Patient sts that he has tried speaking with staff at [facility] but doesn't feel that they are listening to him. Patient also reports use of cocaine 3x's per week. Last use was 3 days ago. Patient has received treatment at 7 places and his last placement was [facility]-3 yrs ago. Patient does not have a therapist and/or psychiatrist.   Patient denies SI. He has a history of 2 prior unintentional drug overdoses. The last overdose was 3+ yrs ago. States that he has no history of self mutilating behaviors. He has no family history of substance use. He does report depressive symptoms including loss of interest in usual pleasures, guilt , and isolating self from others. He denies HI. No legal issues. Denies AVH's. He does not appear to be responding to internal stimuli.   Patient was oriented to time, person, place, time, and situation. His affect was depressed. Affect is congruent with mood. His insight was fair. Impulse control was fair. Patient was dressed in casual clothing and appeared disheveled.     Diagnosis:  Depressive Disorder and Substance Use Disorder  Past Medical History:  Past Medical History:  Diagnosis Date  . Asthma   . 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 Tendonitis     Past Surgical History:  Procedure Laterality Date  . INCISION AND DRAINAGE OF WOUND  08/12/2012   Procedure: IRRIGATION AND DEBRIDEMENT WOUND;  Surgeon: Tennis Must, MD;  Location: [facility];  Service: Orthopedics;  Laterality: Right;  . WISDOM TOOTH EXTRACTION      Family History: No family history on file.  Social History:  reports that he has been smoking cigarettes. He has a 2.50 pack-year smoking history. He has never used smokeless tobacco. He reports previous alcohol use. He reports previous drug use. Drugs: Heroin, IV, and Cocaine.  Additional Social History:  Alcohol / Drug Use Pain Medications: SEE MAR Prescriptions: SEE MAR Over the Counter: SEE MAR History of alcohol / drug use?: Yes Longest period of sobriety (when/how long): unk Negative Consequences of Use: Personal relationships, Financial Substance #1 Name of Substance 1: Methadone 1 - Age of First Use: 18 1 - Amount (size/oz): 115 mg's 1 - Frequency: daily 1 - Duration: 18 months 1 - Last Use / Amount: 03/30/2020 Substance #2 Name of Substance 2: Cocaine 2 - Age of First Use: 29 yrs old 2 - Amount (size/oz): varies 2 - Frequency: 3x's per week 2 - Duration: on-going 2 - Last Use / Amount: "less than a 8 ball"  CIWA: CIWA-Ar BP: 115/75 Pulse Rate: 86 COWS:    Allergies:  Allergies  Allergen Reactions  . Vicodin [Hydrocodone-Acetaminophen] Other (See Comments)    AVOID PAIN KILLERS OR ANY OTHER MEDS THAT MAY CAUSE ADDICTION  . Xanax [Alprazolam] Other (See Comments)    AVOID BENZOS OR ANY OTHER MEDS THAT MAY CAUSE ADDICTION    Home Medications: (Not in a hospital admission)   OB/GYN Status:  No LMP for male  patient.  General Assessment Data TTS Assessment: In system Is this a Tele or Face-to-Face Assessment?: Face-to-Face Is this an Initial Assessment or a Re-assessment for this encounter?: Initial Assessment Patient Accompanied by:: Parent Language Other than English: No Living Arrangements: (lives with grandmother ) What gender do you identify as?: Male Date Telepsych consult ordered in [facility]: (n/a) Time Telepsych consult ordered in [facility]: (n/a) Marital status:  Single Maiden name: (n/a) Pregnancy Status: No Living Arrangements: Other relatives(grandmother ) Can pt return to current living arrangement?: Yes Admission Status: Voluntary Is patient capable of signing voluntary admission?: Yes Referral Source: Self/Family/Friend Insurance type: ([place] )  Medical Screening Exam ([place]) Medical Exam completed: (patient declined )  [place] Arrangements: Other relatives(grandmother ) Legal Guardian: (no legal guardian ) Name of Psychiatrist: ([facility] Name of Therapist: ([facility])  Education Status Is patient currently in school?: No  Risk to self with the past 6 months Suicidal Ideation: No Has patient been a risk to self within the past 6 months prior to admission? : No Suicidal Intent: No Has patient had any suicidal intent within the past 6 months prior to admission? : No Is patient at risk for suicide?: No Suicidal Plan?: No Has patient had any suicidal plan within the past 6 months prior to admission? : No Access to Means: No What has been your use of drugs/alcohol within the last 12 months?: (n/a) Previous Attempts/Gestures: (unitentional drug overdoses x2 ) How many times?: (0) Other Self Harm Risks: (no self harm ) Triggers for Past Attempts: (no triggers fo past attempts ) Intentional Self Injurious Behavior: None Family Suicide History: No Recent stressful life event(s): Other (Comment)("I want to get off Methadone ") Persecutory voices/beliefs?: No Depression: Yes Depression Symptoms: Feeling angry/irritable, Loss of interest in usual pleasures, Isolating, Fatigue Substance abuse history and/or treatment for substance abuse?: No Suicide prevention information given to non-admitted patients: Not applicable  Risk to Others within the past 6 months Homicidal Ideation: No Does patient have any lifetime risk of violence toward others beyond the six months  prior to admission? : No Thoughts of Harm to Others: No Current Homicidal Intent: No Current Homicidal Plan: No Access to Homicidal Means: No Identified Victim: (n/a) History of harm to others?: No Assessment of Violence: None Noted Violent Behavior Description: (Patient is calm and cooperative ) Does patient have access to weapons?: No Criminal Charges Pending?: No Does patient have a court date: No Is patient on probation?: No  Psychosis Hallucinations: None noted Delusions: None noted  Mental Status Report Appearance/Hygiene: Disheveled Eye Contact: Good Motor Activity: Freedom of movement Speech: Logical/coherent Level of Consciousness: Alert Mood: Depressed Affect: Appropriate to circumstance Anxiety Level: None Thought Processes: Relevant, Coherent Judgement: Unimpaired Orientation: Person, Place, Time, Situation Obsessive Compulsive Thoughts/Behaviors: None  Cognitive Functioning Concentration: Normal Memory: Recent Intact, Remote Intact Is patient IDD: No Insight: Good Impulse Control: Good Appetite: Fair Have you had any weight changes? : No Change Sleep: Decreased Vegetative Symptoms: None  ADLScreening [facility] Assessment Services) Patient's cognitive ability adequate to safely complete daily activities?: Yes Patient able to express need for assistance with ADLs?: Yes Independently performs ADLs?: Yes (appropriate for developmental age)  Prior Inpatient Therapy Prior Inpatient Therapy: Yes Prior Therapy Dates: (yes; last facility was [facility]) Prior Therapy Facilty/Provider(s): ("I've been to 7 facilities; last facility was [facility]) Reason for Treatment: (substance use )  Prior Outpatient Therapy Prior Outpatient Therapy: No Does patient have  an ACCT team?: No Does patient have Intensive In-House Services?  : No Does patient have [facility] services? : No Does patient have P4CC services?: No  ADL Screening (condition at time of  admission) Patient's cognitive ability adequate to safely complete daily activities?: Yes Is the patient deaf or have difficulty hearing?: No Does the patient have difficulty seeing, even when wearing glasses/contacts?: No Does the patient have difficulty concentrating, remembering, or making decisions?: No Patient able to express need for assistance with ADLs?: Yes Does the patient have difficulty dressing or bathing?: No Independently performs ADLs?: Yes (appropriate for developmental age) Does the patient have difficulty walking or climbing stairs?: No Weakness of Legs: None Weakness of Arms/Hands: None  Home Assistive Devices/Equipment Home Assistive Devices/Equipment: None    Abuse/Neglect Assessment (Assessment to be complete while patient is alone) Abuse/Neglect Assessment Can Be Completed: Yes Physical Abuse: Denies Verbal Abuse: Denies Sexual Abuse: Denies Exploitation of patient/patient's resources: Denies Self-Neglect: Denies Values / Beliefs Cultural Requests During Hospitalization: None Spiritual Requests During Hospitalization: None   Advance Directives (For Healthcare) Does Patient Have a Medical Advance Directive?: No Nutrition Screen- [place] Adult/[facility]/AP Patient's home diet: Regular        Disposition: Patient decided to leave the facility after inquiring about what type of services were offered. He did not want to continue with the assessment, dipositioning, and medical screening exam.  Patient also  stated that he needed to leave because his father had to be at work by 10pm and this was his transportation home. Patient did allow this clinician to give him a list of referrals and he signed a "Informed Consent to Refuse Screening Form" Disposition Initial Assessment Completed for this Encounter: Yes Disposition of Patient: [place] Discharge, Discharge(No criteia; pt requesting methadone taper) Mode of transportation if patient is discharged/movement?: Car Patient  referred to: Other (Comment)(Referred to followup with insurance for out of state referra)  On Site Evaluation by:   Reviewed with Physician:    Waldon Merl 03/30/2020 11:34 AM

## 2020-04-08 DIAGNOSIS — R825 Elevated urine levels of drugs, medicaments and biological substances: Secondary | ICD-10-CM | POA: Insufficient documentation

## 2020-04-08 DIAGNOSIS — R7982 Elevated C-reactive protein (CRP): Secondary | ICD-10-CM | POA: Insufficient documentation

## 2020-04-08 DIAGNOSIS — F1910 Other psychoactive substance abuse, uncomplicated: Secondary | ICD-10-CM | POA: Insufficient documentation

## 2020-04-08 DIAGNOSIS — S50852A Superficial foreign body of left forearm, initial encounter: Secondary | ICD-10-CM | POA: Insufficient documentation

## 2020-04-08 DIAGNOSIS — F17200 Nicotine dependence, unspecified, uncomplicated: Secondary | ICD-10-CM | POA: Insufficient documentation

## 2021-03-07 ENCOUNTER — Ambulatory Visit (HOSPITAL_COMMUNITY)
Admission: RE | Admit: 2021-03-07 | Discharge: 2021-03-07 | Disposition: A | Discharge status: Home / Self Care | Payer: 59 | Attending: Psychiatry | Admitting: Psychiatry

## 2021-03-07 NOTE — H&P (Signed)
Behavioral Health Medical Screening Exam  [name] is a 30 y.o. male who presents voluntarily as a walk-in to the [facility] with a chief complaint "requesting detox."  Patient states that he "can't stop and not able to work/function without using xanax and heroin." He states that he's been using heroin and xanax everyday for the last 3 weeks. He states that he was sober for 8 months, but he had a "stupid moment and started making bad decisions."  On approach, patient is alert and oriented x 4. Eye contact is fair. Affect is appropriate and mood is euthymic. He is casually dressed. His speech is clear and coherent. Thought process is logical and he is goal oriented. He denies suicidal and homicidal ideations. He denies auditory and visual hallucinations. He does not appear to be responding to internal or external stimuli. He reports an increase use of heroin daily for the last 3 weeks. He reports using 2-3 bags of IV heroin x 2 weeks and snorting x 1 week. He reports last using heroin this evening. He reports using heroin since age 10. He reports taking xanax "a minimal of 6 mgs daily by snorting" for the last 2 weeks. He reports taking more xanax than usual over the last two weeks. He reports last taking xanax this evening. He reports using xanax since age 18. He denies any withdrawal symptoms from benzos, or opiates at this time. He does not appear to be actively withdrawing.   Patient states that he does not have current outpatient psychiatry, or therapy at this time. He is not prescribed any psychotropics at this time. He reports that he last received substance abuse treatment in 2021 at [facility] in [facility]. Per chart review: Patient was last hospitalized at [facility] in 2012 for detox. He states that he received methadone treatment in the past that was not effective because the doctor's didn't know how to manage the methadone.   Patient reports that he resides in [place] with his  Grandmother and sates that his grandmother brought him in tonight to find substance abuse treatment. 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 He reports that he is on probation, but did not provide further information. He denies having access to weapons. He denies any safety concerns with returning home tonight and following up with the provided resources.    Total Time spent with patient: 15 minutes  Psychiatric Specialty Exam:  Presentation  General Appearance: Appropriate for Environment  Eye Contact:Fair  Speech:Clear and Coherent  Speech Volume:Normal  Handedness:Right   Mood and Affect  Mood:Euthymic  Affect:Appropriate   Thought Process  Thought Processes:Coherent; Goal Directed  Descriptions of Associations:Intact  Orientation:Full (Time, Place and Person)  Thought Content:WDL; Logical  History of Schizophrenia/Schizoaffective disorder:No  Duration of Psychotic Symptoms:No data recorded Hallucinations:Hallucinations: None  Ideas of Reference:None  Suicidal Thoughts:Suicidal Thoughts: No  Homicidal Thoughts:Homicidal Thoughts: No   Sensorium  Memory:Immediate Good; Recent Good; Remote Good  Judgment:Fair  Insight:Fair   Executive Functions  Concentration:Fair  Attention Span:Fair  [place]   Psychomotor Activity  Psychomotor Activity:Psychomotor Activity: Normal   Assets  Assets:Communication Skills; Desire for Improvement; Financial Resources/Insurance; Housing; Leisure Time; Physical Health; Social Support; Transportation   Sleep  Sleep:Sleep: [place] Number of Hours of Sleep: 8 ("Varies. On average a total of 8 hours.")    Physical Exam: Physical Exam Constitutional:      Appearance: Normal appearance.  HENT:  Head: Normocephalic and atraumatic.     Nose: Nose normal.  Cardiovascular:     Rate and Rhythm: Tachycardia present.  Pulmonary:     Effort:  Pulmonary effort is normal.     Breath sounds: Normal breath sounds.  Musculoskeletal:        General: Normal range of motion.     Cervical back: Normal range of motion.  Neurological:     General: No focal deficit present.     Mental Status: He is alert and oriented to person, place, and time.  Psychiatric:        Mood and Affect: Mood normal.        Behavior: Behavior normal.        Thought Content: Thought content normal.        Judgment: Judgment normal.    Review of Systems  Constitutional: Negative.   HENT: Negative.   Eyes: Negative.   Respiratory: Negative.   Cardiovascular: Negative.   Gastrointestinal: Negative.   Genitourinary: Negative.   Musculoskeletal: Negative.   Skin: Negative.   Neurological: Negative.   Endo/Heme/Allergies: Negative.   Psychiatric/Behavioral: Positive for substance abuse. Negative for depression, hallucinations and suicidal ideas. The patient is not nervous/anxious and does not have insomnia.    Blood pressure (!) 142/77, pulse (!) 111, temperature 98.6 F (37 C), temperature source Oral, SpO2 96 %. There is no height or weight on file to calculate BMI.  Musculoskeletal: Strength & Muscle Tone: within normal limits Gait & Station: normal Patient leans: N/A   Recommendations:  Based on my evaluation the patient does not appear to have an emergency medical condition.   No evidence of imminent risk to self or others at present. No psychosis observed.    Patient does not meet criteria for psychiatric inpatient admission, or overnight observation.   Supportive therapy provided on substance abuse. Patient provided with a list of substance abuse/detox facilities.   Discussed crisis plan, support from social network, calling 911, coming to the Emergency Department or [facility] and calling the Suicide Hotline.  Marissa Calamity, NP 03/07/2021, 10:11 PM
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
## 2021-08-03 ENCOUNTER — Other Ambulatory Visit: Payer: Self-pay | Admitting: *Deleted

## 2021-08-03 DIAGNOSIS — J45909 Unspecified asthma, uncomplicated: Secondary | ICD-10-CM

## 2021-08-03 NOTE — Progress Notes (Signed)
Per asthma protocol, Ige and cbc w/ diff ordered.  Patient stated he would come by office for lab draw next week before scheduled consult with Dr. Shearon Stalls 08/15/21.

## 2021-08-15 ENCOUNTER — Institutional Professional Consult (permissible substitution): Payer: 59 | Admitting: Internal Medicine

## 2021-10-24 ENCOUNTER — Emergency Department (HOSPITAL_COMMUNITY): Payer: 59

## 2021-10-24 ENCOUNTER — Emergency Department (HOSPITAL_COMMUNITY)
Admission: EM | Admit: 2021-10-24 | Discharge: 2021-10-24 | Payer: 59 | Attending: Emergency Medicine | Admitting: Emergency Medicine

## 2021-10-24 ENCOUNTER — Encounter (HOSPITAL_COMMUNITY): Payer: Self-pay | Admitting: *Deleted

## 2021-10-24 ENCOUNTER — Other Ambulatory Visit: Payer: Self-pay

## 2021-10-24 DIAGNOSIS — Z79899 Other long term (current) drug therapy: Secondary | ICD-10-CM | POA: Diagnosis not present

## 2021-10-24 DIAGNOSIS — T148XXA Other injury of unspecified body region, initial encounter: Secondary | ICD-10-CM

## 2021-10-24 DIAGNOSIS — L089 Local infection of the skin and subcutaneous tissue, unspecified: Secondary | ICD-10-CM

## 2021-10-24 LAB — LACTIC ACID, PLASMA: Lactic Acid, Venous: 1.3 mmol/L (ref 0.5–1.9)

## 2021-10-24 LAB — COMPREHENSIVE METABOLIC PANEL
ALT: 28 U/L (ref 0–44)
AST: 70 U/L — ABNORMAL HIGH (ref 15–41)
Albumin: 3.5 g/dL (ref 3.5–5.0)
Alkaline Phosphatase: 87 U/L (ref 38–126)
Anion gap: 8 (ref 5–15)
BUN: 12 mg/dL (ref 6–20)
CO2: 28 mmol/L (ref 22–32)
Calcium: 9.1 mg/dL (ref 8.9–10.3)
Chloride: 101 mmol/L (ref 98–111)
Creatinine, Ser: 0.75 mg/dL (ref 0.61–1.24)
GFR, Estimated: >60 mL/min (ref >60)
Glucose, Bld: 118 mg/dL — ABNORMAL HIGH (ref 70–99)
Potassium: 4 mmol/L (ref 3.5–5.1)
Sodium: 137 mmol/L (ref 135–145)
Total Bilirubin: 0.5 mg/dL (ref 0.3–1.2)
Total Protein: 7.6 g/dL (ref 6.5–8.1)

## 2021-10-24 LAB — CBC WITH DIFFERENTIAL/PLATELET
Abs Immature Granulocytes: 0.01 10*3/uL (ref 0.00–0.07)
Basophils Absolute: 0 10*3/uL (ref 0.0–0.1)
Basophils Relative: 1 %
Eosinophils Absolute: 0.1 10*3/uL (ref 0.0–0.5)
Eosinophils Relative: 3 %
HCT: 34.7 % — ABNORMAL LOW (ref 39.0–52.0)
Hemoglobin: 11.1 g/dL — ABNORMAL LOW (ref 13.0–17.0)
Immature Granulocytes: 0 %
Lymphocytes Relative: 35 %
Lymphs Abs: 1.4 10*3/uL (ref 0.7–4.0)
MCH: 27.2 pg (ref 26.0–34.0)
MCHC: 32 g/dL (ref 30.0–36.0)
MCV: 85 fL (ref 80.0–100.0)
Monocytes Absolute: 0.3 10*3/uL (ref 0.1–1.0)
Monocytes Relative: 8 %
Neutro Abs: 2.2 10*3/uL (ref 1.7–7.7)
Neutrophils Relative %: 53 %
Platelets: 227 10*3/uL (ref 150–400)
RBC: 4.08 MIL/uL — ABNORMAL LOW (ref 4.22–5.81)
RDW: 12.7 % (ref 11.5–15.5)
WBC: 4.1 10*3/uL (ref 4.0–10.5)
nRBC: 0 % (ref 0.0–0.2)

## 2021-10-24 MED ORDER — AMOXICILLIN-POT CLAVULANATE 875-125 MG PO TABS
1.0000 | ORAL_TABLET | Freq: Once | ORAL | Status: AC
  Administered 2021-10-24: 1 via ORAL
  Filled 2021-10-24: qty 1

## 2021-10-24 MED ORDER — AMOXICILLIN-POT CLAVULANATE 875-125 MG PO TABS: 1.0000 | ORAL_TABLET | Freq: Two times a day (BID) | ORAL | 0 refills | Status: DC

## 2021-10-24 NOTE — ED Provider Notes (Signed)
[facility] DEPT Provider Note   CSN: [id number] Arrival date & time: 10/24/21  1444     History  Chief Complaint  Patient presents with   Arm Injury    [name] is a 31 y.o. male.  HPI     Home Medications Prior to Admission medications   Medication Sig Start Date End Date Taking? Authorizing Provider  amoxicillin-clavulanate (AUGMENTIN) 875-125 MG tablet Take 1 tablet by mouth 2 (two) times daily. One po bid x 7 days 10/24/21  Yes Daleen Bo, MD  albuterol (PROVENTIL) (2.5 MG/3ML) 0.083% nebulizer solution Take 3 mLs (2.5 mg total) by nebulization every 6 (six) hours as needed for wheezing or shortness of breath. 06/23/19   Lavina Hamman, MD  albuterol (VENTOLIN HFA) 108 (90 Base) MCG/ACT inhaler Inhale 2 puffs into the lungs every 6 (six) hours as needed for wheezing or shortness of breath. 09/02/19   Lorella Nimrod, MD  albuterol (VENTOLIN HFA) 108 (90 Base) MCG/ACT inhaler Inhale 1 puff into the lungs every 4 (four) hours. 09/02/19   Lorella Nimrod, MD  guaiFENesin (ROBITUSSIN) 100 MG/5ML SOLN Take 5 mLs (100 mg total) by mouth every 4 (four) hours. 09/02/19   Lorella Nimrod, MD  hydrOXYzine (ATARAX/VISTARIL) 25 MG tablet Take 1 tablet (25 mg total) by mouth 3 (three) times daily as needed for anxiety. 06/23/19   Lavina Hamman, MD  ipratropium (ATROVENT) 0.02 % nebulizer solution Take 2.5 mLs (0.5 mg total) by nebulization every 6 (six) hours as needed for wheezing or shortness of breath. 06/23/19   Lavina Hamman, MD  methadone (DOLOPHINE) 1 mg/ml oral solution Take 120 mg/kg by mouth daily.     [provider]  nicotine (NICODERM CQ - DOSED IN MG/24 HOURS) 21 mg/24hr patch Place 1 patch (21 mg total) onto the skin daily. Patient not taking: Reported on 08/31/2019 06/23/19   Lavina Hamman, MD  sertraline (ZOLOFT) 50 MG tablet Take 50 mg by mouth daily.    [provider]      Allergies    Vicodin  [hydrocodone-acetaminophen] and Xanax [alprazolam]    Review of Systems   Review of Systems  Physical Exam Updated Vital Signs BP 121/75    Pulse (!) 50    Temp 97.6 F (36.4 C) (Oral)    Resp 18    Ht 6\' 3"  (1.905 m)    Wt 86.2 kg    SpO2 100%    BMI 23.75 kg/m  Physical Exam Vitals and nursing note reviewed.  Constitutional:      Appearance: He is well-developed. He is not ill-appearing.  HENT:     Head: Normocephalic and atraumatic.     Right Ear: External ear normal.     Left Ear: External ear normal.  Eyes:     Conjunctiva/sclera: Conjunctivae normal.     Pupils: Pupils are equal, round, and reactive to light.  Neck:     Trachea: Phonation normal.  Cardiovascular:     Rate and Rhythm: Normal rate and regular rhythm.     Heart sounds: Normal heart sounds.  Pulmonary:     Effort: Pulmonary effort is normal.     Breath sounds: Normal breath sounds.  Abdominal:     Palpations: Abdomen is soft.     Tenderness: There is no abdominal tenderness.  Musculoskeletal:        General: Normal range of motion.     Cervical back: Normal range of motion and neck supple.  Skin:  General: Skin is warm and dry.  Neurological:     Mental Status: He is alert and oriented to person, place, and time.     Cranial Nerves: No cranial nerve deficit.     Sensory: No sensory deficit.     Motor: No abnormal muscle tone.     Coordination: Coordination normal.  Psychiatric:        Behavior: Behavior normal.        Thought Content: Thought content normal.        Judgment: Judgment normal.      ED Results / Procedures / Treatments   Labs (all labs ordered are listed, but only abnormal results are displayed) Labs Reviewed  COMPREHENSIVE METABOLIC PANEL - Abnormal; Notable for the following components:      Result Value   Glucose, Bld 118 (*)    AST 70 (*)    All other components within normal limits  CBC WITH DIFFERENTIAL/PLATELET - Abnormal; Notable for the following components:    RBC 4.08 (*)    Hemoglobin 11.1 (*)    HCT 34.7 (*)    All other components within normal limits  CULTURE, BLOOD (ROUTINE X 2)  CULTURE, BLOOD (ROUTINE X 2)  LACTIC ACID, PLASMA    EKG None  Radiology DG Forearm Left  Result Date: 10/24/2021 CLINICAL DATA:  Wound in the skin, evaluate for osteomyelitis EXAM: LEFT FOREARM - 2 VIEW COMPARISON:  None. FINDINGS: No fracture or dislocation is seen. There are no focal lytic lesions. There is soft tissue swelling, more so along the dorsal aspect. There is a thin linear radiopacity in the soft tissues adjacent to the dorsal aspect of proximal shaft of ulna. IMPRESSION: No fracture or dislocation is seen. There is 4 mm thin linear radiopacity in the soft tissues adjacent to the dorsal aspect of proximal shaft of ulna suggesting possible foreign body in the soft tissues. There are no focal lytic lesions in the bony structures. If there is clinical suspicion for osteomyelitis, three-phase bone scan or MRI may be considered. Electronically Signed   By: Elmer Picker M.D.   On: 10/24/2021 15:59    Procedures Procedures    Medications Ordered in ED Medications  amoxicillin-clavulanate (AUGMENTIN) 875-125 MG per tablet 1 tablet (1 tablet Oral Given 10/24/21 1828)    ED Course/ Medical Decision Making/ A&P                           Medical Decision Making The patient is presenting for recurrent open wound problems, present for several years, reportedly worse over the last couple weeks.  He is a vague historian.  He was incarcerated today, unexpectedly, after court hearing.  He was evaluated at the jail and sent here because of the wound.  He reports only discomfort in the wound.  He denies fever, chills, weakness or dizziness.  Problems Addressed: Wound infection: chronic illness or injury    Details: Chronic wound with possible mild acute infection.  No evidence for deep tissue infection, or abscess.  Amount and/or Complexity of Data  Reviewed Labs: ordered.    Details: No acute abnormalities Radiology: ordered.    Details: No acute abnormalities.  Possible retained foreign body that appears to be a small piece of the needle. Discussion of management or test interpretation with external provider(s): Wound care in the ED with cleansing, Xeroform dressing and Kling.  Instructions given for ongoing wound management.  Referred to wound care clinic for further  care and treatment ASAP.  Would suggest daily wound care after initial treatment for 2 days.  Risk Prescription drug management.           Final Clinical Impression(s) / ED Diagnoses Final diagnoses:  Wound infection    Rx / DC Orders ED Discharge Orders          Ordered    amoxicillin-clavulanate (AUGMENTIN) 875-125 MG tablet  2 times daily        10/24/21 1832              Daleen Bo, MD 10/24/21 [phone]
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
## 2021-10-24 NOTE — Discharge Instructions (Signed)
We are starting you on antibiotic, to treat for infection.  You will need to follow-up at the wound care clinic for further care and treatment.  Hopefully this can be done within the next 2 days.  Keep the bandage on until you see the provider in the wound care clinic.  If the appointment cannot be obtained before the weekend, additional wound care will be required.  At that time, daily cleansing with soap and rinse with water, followed by saline wet-to-dry dressing, will be required.  Return here, if needed for problems.

## 2021-10-24 NOTE — ED Notes (Signed)
Pt provided ham and [place] sandwich.

## 2021-10-24 NOTE — ED Notes (Signed)
Patient currently in police custody. Discharge instructions given.

## 2021-10-24 NOTE — ED Notes (Signed)
Wound care performed by previous nurse, Hulan Fess, on this patient. Wound was cleaned with wound cleaner and saline. Wound was dried and xeroform was applied directly to the wound. Arms was wrapped in guaze.

## 2021-10-24 NOTE — ED Notes (Signed)
Wet to dry dressing applied to left arm.

## 2021-10-24 NOTE — ED Provider Notes (Signed)
Emergency Medicine Provider Triage Evaluation Note  Kenniel Bergsma , a 31 y.o. male  was evaluated in triage.  Pt complains of left arm wound from IV drug use and injury.  Patient is from jail.   Review of Systems  Positive: Left arm wound Negative: Fever, Chills  Physical Exam  BP (!) 131/102 (BP Location: Right Arm)    Pulse (!) 57    Temp 98 F (36.7 C) (Oral)    Resp 18    Ht 6\' 3"  (1.905 m)    Wt 86.2 kg    SpO2 100%    BMI 23.75 kg/m  Gen:   Awake, no distress   Resp:  Normal effort  MSK:   Moves extremities without difficulty  Other:  Seen removed in triage, wound extends almost the length of the entire left lateral forearm with significant tissue breakdown and purulent drainage  Medical Decision Making  Medically screening exam initiated at 3:19 PM.  Appropriate orders placed.  Egbert Seidel was informed that the remainder of the evaluation will be completed by another provider, this initial triage assessment does not replace that evaluation, and the importance of remaining in the ED until their evaluation is complete.     Estill Cotta 10/24/21 1520    Daleen Bo, MD 10/24/21 1745

## 2021-10-24 NOTE — ED Notes (Signed)
Unsuccessful IV attempt x2 in R AC.

## 2021-10-29 LAB — CULTURE, BLOOD (ROUTINE X 2)
Culture: NO GROWTH
Special Requests: ADEQUATE

## 2021-12-05 ENCOUNTER — Encounter (HOSPITAL_BASED_OUTPATIENT_CLINIC_OR_DEPARTMENT_OTHER): Payer: 59 | Attending: Internal Medicine | Admitting: Internal Medicine

## 2022-03-15 IMAGING — CR DG FOREARM 2V*L*
2 series · 2 of 2 positions shown · non-contrast
Comparison: None.

CLINICAL DATA: Wound in the skin, evaluate for osteomyelitis

EXAM:
LEFT FOREARM - 2 VIEW

[x forearm ap left]
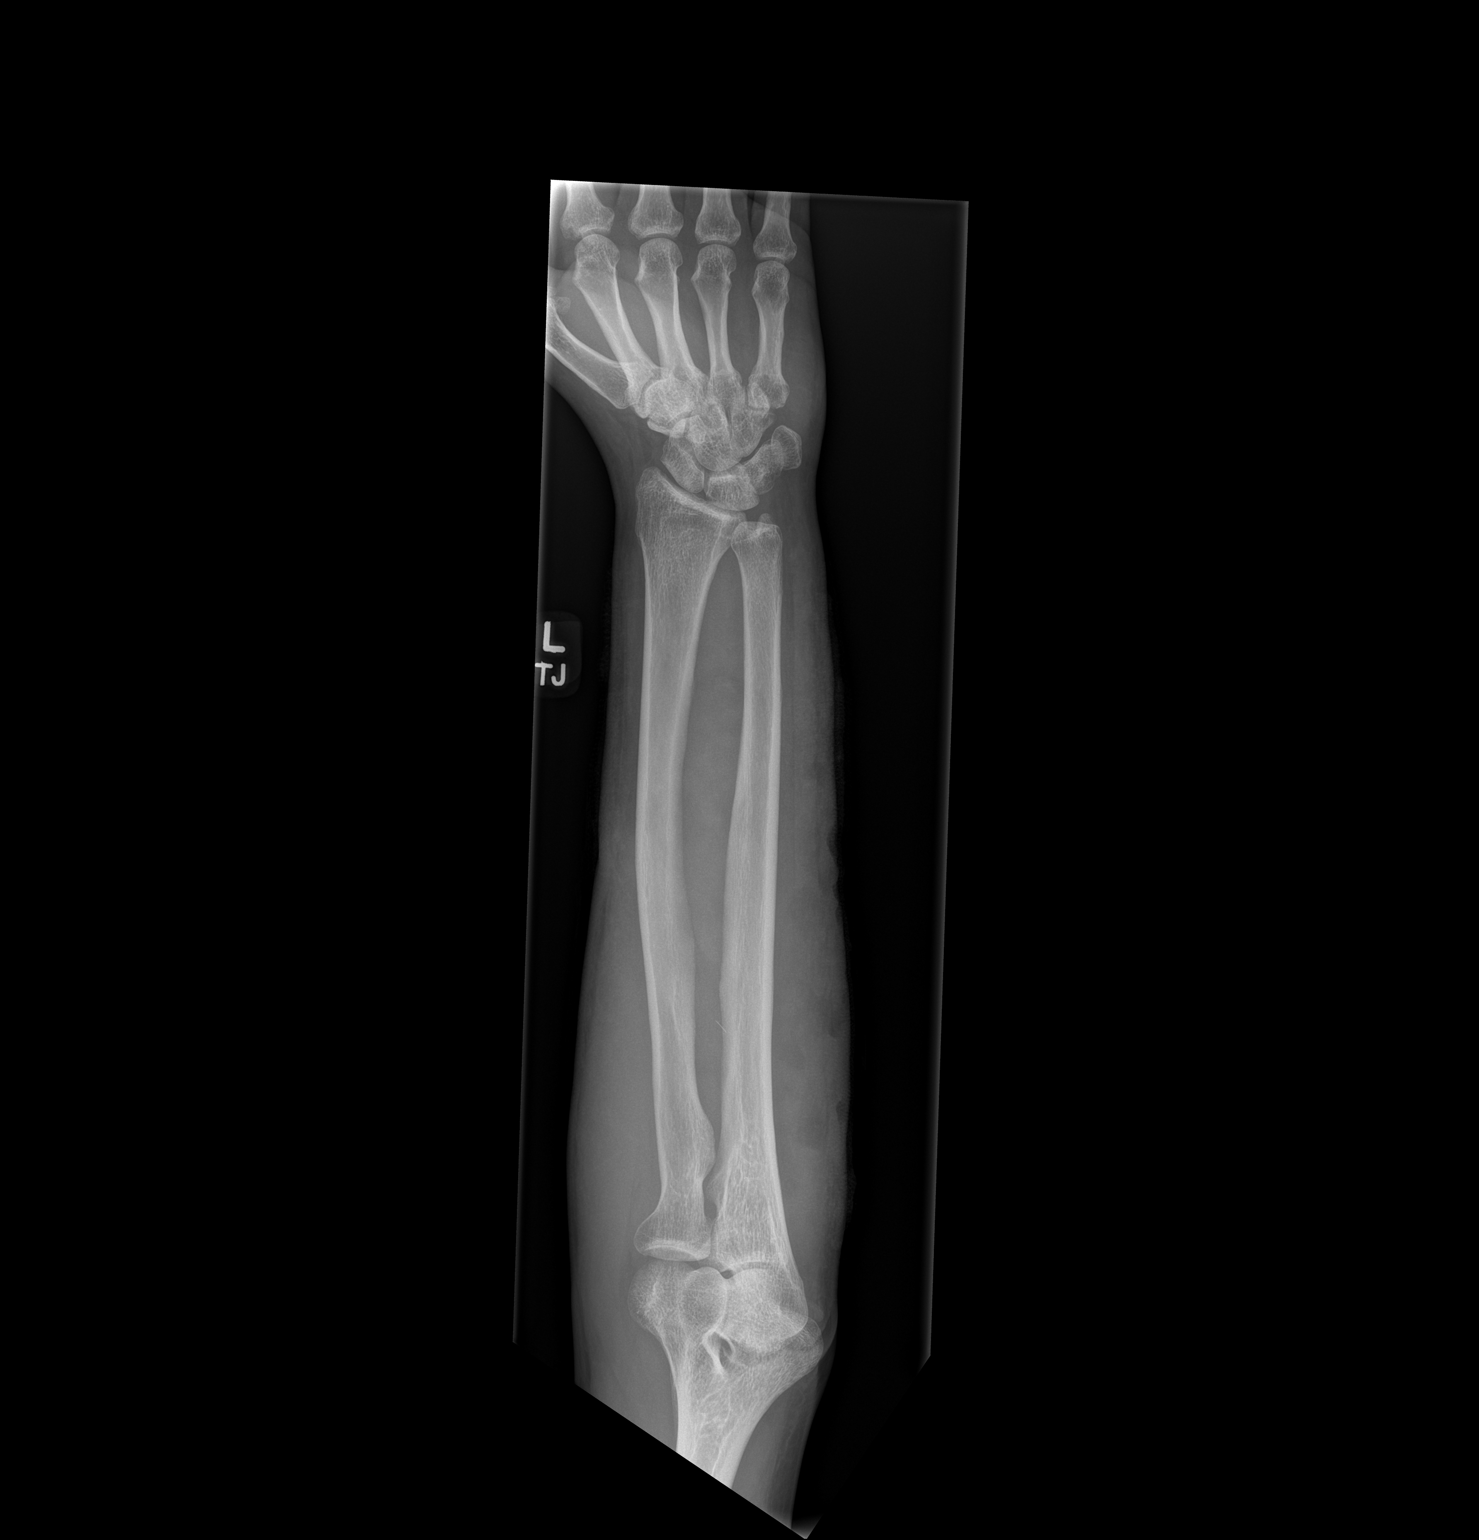

[x forearm lat left]
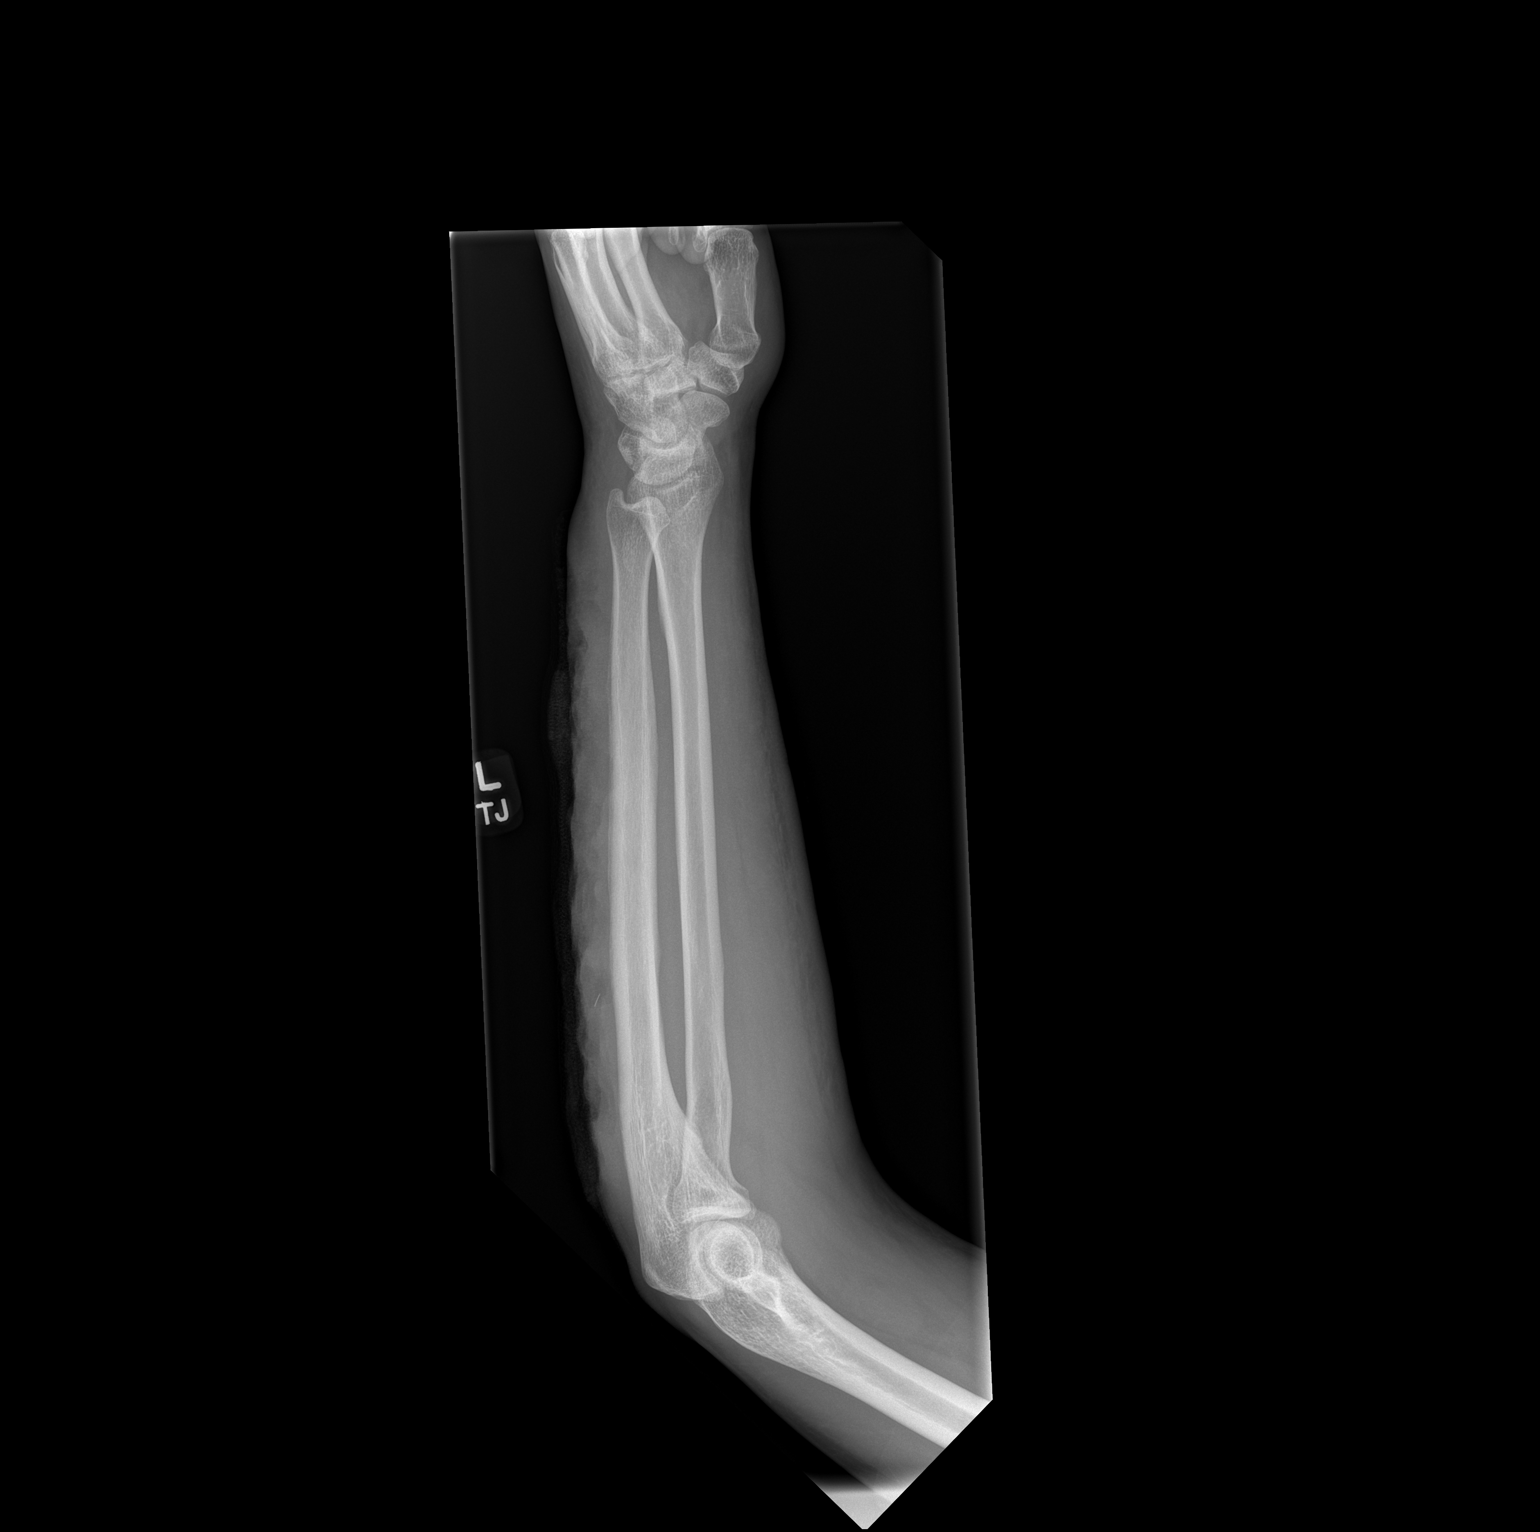

[2 of 2 positions shown; findings below may reference images not displayed]

FINDINGS: No fracture or dislocation is seen. There are no focal lytic
lesions. There is soft tissue swelling, more so along the dorsal
aspect. There is a thin linear radiopacity in the soft tissues
adjacent to the dorsal aspect of proximal shaft of ulna.
IMPRESSION: No fracture or dislocation is seen.

There is 4 mm thin linear radiopacity in the soft tissues adjacent
to the dorsal aspect of proximal shaft of ulna suggesting possible
foreign body in the soft tissues.

There are no focal lytic lesions in the bony structures. If there is
clinical suspicion for osteomyelitis, three-phase bone scan or MRI
may be considered.

## 2022-05-10 ENCOUNTER — Encounter: Payer: Self-pay | Admitting: Emergency Medicine

## 2022-05-10 ENCOUNTER — Ambulatory Visit
Admission: EM | Admit: 2022-05-10 | Discharge: 2022-05-10 | Disposition: A | Discharge status: Home / Self Care | Payer: 59

## 2022-05-10 DIAGNOSIS — R600 Localized edema: Secondary | ICD-10-CM

## 2022-05-10 DIAGNOSIS — L03119 Cellulitis of unspecified part of limb: Secondary | ICD-10-CM | POA: Diagnosis not present

## 2022-05-10 MED ORDER — DOXYCYCLINE HYCLATE 100 MG PO CAPS: 100.0000 mg | ORAL_CAPSULE | Freq: Two times a day (BID) | ORAL | 0 refills | Status: AC

## 2022-05-10 MED ORDER — AMOXICILLIN-POT CLAVULANATE 875-125 MG PO TABS: 1.0000 | ORAL_TABLET | Freq: Two times a day (BID) | ORAL | 0 refills | Status: AC

## 2022-05-10 NOTE — ED Triage Notes (Signed)
Pt here with bilateral lower leg edema x 2 days. Pt also here with wound check on left forearm.

## 2022-05-10 NOTE — ED Provider Notes (Signed)
[facility]    CSN: [id number] Arrival date & time: 05/10/22  1444    HISTORY   Chief Complaint  Patient presents with   Ankle Pain   Leg Pain   Wound Check   HPI [name] is a pleasant, 31 y.o. male who presents to urgent care today. Patient presents to urgent care complaining of bilateral lower extremity edema with redness, left greater than right present for many months.  Patient states he is also been dealing with a poorly healing wound on his left forearm, last seen in January at [facility] emergency room where he was prescribed Augmentin and advised to perform dressing changes himself at home until he was able to follow-up with wound care.  Patient states he does not have insurance at this time and has never followed up with wound care.  After multiple requests to evaluate the wound and offers to perform dressing changes, patient politely declined evaluation of the wounds on his left arm which at this time is covered with a sport sleeve similar to those worn by a soccer goalie.  EMR reviewed, there is a graphic picture of the wounds on his left forearm and his encounter note from his visit to [facility] emergency room in January, please see this note for reference.  Patient reports a history of IV drug abuse, states he took a treatment is not currently taking antidepressants which he states have made a world of difference, states currently not using.  Patient reports feeling well otherwise, patient has normal vital signs on arrival.  Patient denies fever, increased swelling or redness in his left hand or arm.  Patient states his arm is not painful.  Patient denies pain in his lower extremities as well but says that occasionally they feel warm.    The history is provided by the patient.   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 Authorizing Provider  amoxicillin-clavulanate (AUGMENTIN) 875-125 MG tablet Take 1 tablet by mouth 2 (two) times daily for 7 days. 05/10/22 05/17/22 Yes Lynden Oxford Scales, PA-C  doxycycline (VIBRAMYCIN) 100 MG capsule Take 1 capsule (100 mg total) by mouth 2 (two) times daily for 7 days. 05/10/22 05/17/22 Yes Lynden Oxford Scales, PA-C  albuterol (PROVENTIL) (2.5 MG/3ML) 0.083% nebulizer solution Take 3 mLs (2.5 mg total) by nebulization every 6 (six) hours as needed for wheezing or shortness of breath. 06/23/19   Lavina Hamman, MD  albuterol (VENTOLIN HFA) 108 (90 Base) MCG/ACT inhaler Inhale 2 puffs into  the lungs every 6 (six) hours as needed for wheezing or shortness of breath. 09/02/19   Lorella Nimrod, MD  albuterol (VENTOLIN HFA) 108 (90 Base) MCG/ACT inhaler Inhale 1 puff into the lungs every 4 (four) hours. 09/02/19   Lorella Nimrod, MD  Desvenlafaxine ER (PRISTIQ) 50 MG TB24 Take 1 tablet by mouth every morning. 05/03/22   [provider]  guaiFENesin (ROBITUSSIN) 100 MG/5ML SOLN Take 5 mLs (100 mg total) by mouth every 4 (four) hours. 09/02/19   Lorella Nimrod, MD  hydrOXYzine (ATARAX/VISTARIL) 25 MG tablet Take  1 tablet (25 mg total) by mouth 3 (three) times daily as needed for anxiety. 06/23/19   Lavina Hamman, MD  ipratropium (ATROVENT) 0.02 % nebulizer solution Take 2.5 mLs (0.5 mg total) by nebulization every 6 (six) hours as needed for wheezing or shortness of breath. 06/23/19   Lavina Hamman, MD  methadone (DOLOPHINE) 1 mg/ml oral solution Take 120 mg/kg by mouth daily.     [provider]  mirtazapine (REMERON) 7.5 MG tablet Take by mouth. 04/19/22   [provider]  sertraline (ZOLOFT) 50 MG tablet Take 50 mg by mouth daily.    [provider]    Family History History reviewed. No pertinent family history. Social History Social History   Tobacco Use   Smoking status: Every Day    Packs/day: 0.50    Years: 5.00    Total pack years: 2.50    Types: Cigarettes   Smokeless tobacco: Never  Vaping Use   Vaping Use: Never used  Substance Use Topics   Alcohol use: Not Currently    Comment: last used on 08/10/2012   Drug use: Not Currently    Types: Heroin, IV, Cocaine    Comment: on methadone program started ~ 03-2011     Allergies   Vicodin [hydrocodone-acetaminophen] and Xanax [alprazolam]  Review of Systems Review of Systems Pertinent findings revealed after performing a 14 point review of systems has been noted in the history of present illness.  Physical Exam Triage Vital Signs ED Triage Vitals  Enc Vitals Group     BP 08/18/21 0827 (!) 147/82     Pulse Rate 08/18/21 0827 72     Resp 08/18/21 0827 18     Temp 08/18/21 0827 98.3 F (36.8 C)     Temp Source 08/18/21 0827 Oral     SpO2 08/18/21 0827 98 %     Weight --      Height --      Head Circumference --      Peak Flow --      Pain Score 08/18/21 0826 5     Pain Loc --      Pain Edu? --      Excl. in [place]? --   No data found.  Updated Vital Signs BP 114/69   Pulse 81   Temp 98.8 F (37.1 C)   Resp 20   SpO2 95%   Physical Exam Vitals and nursing note reviewed.  Constitutional:       General: He is not in acute distress.    Appearance: Normal appearance. He is normal weight. He is not ill-appearing.  HENT:     Head: Normocephalic and atraumatic.  Eyes:     Extraocular Movements: Extraocular movements intact.     Conjunctiva/sclera: Conjunctivae normal.     Pupils: Pupils are equal, round, and reactive to light.  Cardiovascular:     Rate and Rhythm: Normal rate and regular  rhythm.     Comments: Bilateral lower extremity erythema as well, there is no meaningful edema or erythema appreciated in bilateral feet. Pulmonary:     Effort: Pulmonary effort is normal.     Breath sounds: Normal breath sounds.  Musculoskeletal:        General: Normal range of motion.     Cervical back: Normal range of motion and neck supple.     Right lower leg: 3+ Edema present.     Left lower leg: 4+ Edema present.  Skin:    General: Skin is warm and dry.  Neurological:     General: No focal deficit present.     Mental Status: He is alert and oriented to person, place, and time. Mental status is at baseline.  Psychiatric:        Mood and Affect: Mood normal.        Behavior: Behavior normal.        Thought Content: Thought content normal.        Judgment: Judgment normal.     Visual Acuity Right Eye Distance:   Left Eye Distance:   Bilateral Distance:    Right Eye Near:   Left Eye Near:    Bilateral Near:     UC Couse / Diagnostics / Procedures:     Radiology No results found.  Procedures Procedures (including critical care time) EKG  Pending results:  Labs Reviewed  CBC WITH DIFFERENTIAL/PLATELET  COMPREHENSIVE METABOLIC PANEL    Medications Ordered in UC: Medications - No data to display  UC Diagnoses / Final Clinical Impressions(s)   I have reviewed the triage vital signs and the nursing notes.  Pertinent labs & imaging results that were available during my care of the patient were reviewed by me and considered in my medical decision making (see chart  for details).    Final diagnoses:  Bilateral lower extremity edema  Cellulitis of lower extremity, unspecified laterality   Patient politely declined dressing change or wound evaluation in his left forearm at this time.  Patient provided with prescriptions for Augmentin and doxycycline for bilateral lower extremity cellulitis.  Obtain CBC and metabolic panel to evaluate liver and kidney function, will notify patient of results once received and recommend patient begin Lasix if kidney function is normal to help with lower extremity edema.  Recommend furosemide 40 mg with 20 equivalents of potassium chloride daily in the morning x 30 days.  Patient provided with contact information for several wound clinics in [place] and advised him to reach out to them to discuss payment plans and low-cost options.  Patient also encouraged to reach out to the health insurance clearing house to find out if you qualify for low cost [employer] insurance.  Return precautions advised. ED Prescriptions     Medication Sig Dispense Auth. Provider   amoxicillin-clavulanate (AUGMENTIN) 875-125 MG tablet Take 1 tablet by mouth 2 (two) times daily for 7 days. 14 tablet Lynden Oxford Scales, PA-C   doxycycline (VIBRAMYCIN) 100 MG capsule Take 1 capsule (100 mg total) by mouth 2 (two) times daily for 7 days. 14 capsule Lynden Oxford Scales, PA-C      PDMP not reviewed this encounter.  Pending results:  Labs Reviewed  CBC WITH DIFFERENTIAL/PLATELET  COMPREHENSIVE METABOLIC PANEL    Discharge Instructions:   Discharge Instructions      Please begin Augmentin and Vibramycin, 1 of each twice daily for the next 7 days.  Once we receive the results of your metabolic panel  and your complete blood cell count, recommendations for medications to alleviate the swelling in your lower extremities will be provided for you.  If your kidney function and liver functions are normal, you are provided with a prescription for Lasix  which is a diuretic, this helps your body eliminate excess fluid.  When you take Lasix, you also need to take a potassium supplement with that to prevent your body from losing excessive amounts of potassium.  Both these prescriptions will be provided for you.  If your liver and/or kidney functions are abnormal, further recommendations will be provided to treat this.  If your complete blood cell count is worrisome for acute infection, you will be advised to go to the emergency room for further evaluation and care.  I provided you with contact information for to [place] wound care centers that you should reach out to you to find out if they have payment options for uninsured patients.  You may also consider contacting the following wound care clinics to find out the same information:  [facility] wound care center, phone number [phone] [facility] [place], phone number [phone] [facility] in [place], [phone]  Thank you for visiting urgent care today.  We wish you all the best in your journey.    Disposition Upon Discharge:  Condition: stable for discharge home  Patient presented with an acute illness with associated systemic symptoms and significant discomfort requiring urgent management. In my opinion, this is a condition that a prudent lay person (someone who possesses an average knowledge of health and medicine) may potentially expect to result in complications if not addressed urgently such as respiratory distress, impairment of bodily function or dysfunction of bodily organs.   Routine symptom specific, illness specific and/or disease specific instructions were discussed with the patient and/or caregiver at length.   As such, the patient has been evaluated and assessed, work-up was performed and treatment was provided in alignment with urgent care protocols and evidence based medicine.   Patient/parent/caregiver has been advised that the patient may require follow up for further testing and treatment if the symptoms continue in spite of treatment, as clinically indicated and appropriate.  Patient/parent/caregiver has been advised to return to the [facility] or PCP if no better; to PCP or the Emergency Department if new signs and symptoms develop, or if the current signs or symptoms continue to change or worsen for further workup, evaluation and treatment as clinically indicated and appropriate  The patient will follow up with their current PCP if and as advised. If the patient does not currently have a PCP we will assist them in obtaining one.   The patient may need specialty follow up if the symptoms continue, in spite of conservative treatment and management, for further workup, evaluation, consultation and treatment as clinically indicated and appropriate.   Patient/parent/caregiver verbalized understanding and agreement of plan as discussed.  All questions were addressed during visit.  Please see discharge instructions below for further details of plan.  This office note has been dictated using Museum/gallery curator.  Unfortunately, this method of dictation can sometimes lead to typographical or grammatical errors.  I apologize for your inconvenience in advance if this occurs.  Please do not hesitate to reach out to me if clarification is needed.      Lynden Oxford Scales, [place] 05/10/22 [phone]

## 2022-05-10 NOTE — Discharge Instructions (Addendum)
Please begin Augmentin and Vibramycin, 1 of each twice daily for the next 7 days.  Once we receive the results of your metabolic panel and your complete blood cell count, recommendations for medications to alleviate the swelling in your lower extremities will be provided for you.  If your kidney function and liver functions are normal, you are provided with a prescription for Lasix which is a diuretic, this helps your body eliminate excess fluid.  When you take Lasix, you also need to take a potassium supplement with that to prevent your body from losing excessive amounts of potassium.  Both these prescriptions will be provided for you.  If your liver and/or kidney functions are abnormal, further recommendations will be provided to treat this.  If your complete blood cell count is worrisome for acute infection, you will be advised to go to the emergency room for further evaluation and care.  I provided you with contact information for to [place] wound care centers that you should reach out to you to find out if they have payment options for uninsured patients.  You may also consider contacting the following wound care clinics to find out the same information:  [facility] wound care center, phone number [phone] [facility] [place], phone number [phone] [facility] in [place], [phone]  Thank you for visiting urgent care today.  We wish you all the best in your journey.

## 2022-05-11 LAB — CBC WITH DIFFERENTIAL/PLATELET
Basophils Absolute: 0 10*3/uL (ref 0.0–0.2)
Basos: 0 %
EOS (ABSOLUTE): 0.1 10*3/uL (ref 0.0–0.4)
Eos: 3 %
Hematocrit: 30 % — ABNORMAL LOW (ref 37.5–51.0)
Hemoglobin: 9.2 g/dL — ABNORMAL LOW (ref 13.0–17.7)
Immature Grans (Abs): 0 10*3/uL (ref 0.0–0.1)
Immature Granulocytes: 0 %
Lymphocytes Absolute: 1.3 10*3/uL (ref 0.7–3.1)
Lymphs: 37 %
MCH: 22.5 pg — ABNORMAL LOW (ref 26.6–33.0)
MCHC: 30.7 g/dL — ABNORMAL LOW (ref 31.5–35.7)
MCV: 73 fL — ABNORMAL LOW (ref 79–97)
Monocytes Absolute: 0.4 10*3/uL (ref 0.1–0.9)
Monocytes: 10 %
Neutrophils Absolute: 1.8 10*3/uL (ref 1.4–7.0)
Neutrophils: 50 %
Platelets: 277 10*3/uL (ref 150–450)
RBC: 4.09 x10E6/uL — ABNORMAL LOW (ref 4.14–5.80)
RDW: 14 % (ref 11.6–15.4)
WBC: 3.7 10*3/uL (ref 3.4–10.8)

## 2022-05-11 LAB — COMPREHENSIVE METABOLIC PANEL
ALT: 21 IU/L (ref 0–44)
AST: 31 IU/L (ref 0–40)
Albumin/Globulin Ratio: 1.2 (ref 1.2–2.2)
Albumin: 3.6 g/dL — ABNORMAL LOW (ref 4.1–5.1)
Alkaline Phosphatase: 163 IU/L — ABNORMAL HIGH (ref 44–121)
BUN/Creatinine Ratio: 7 — ABNORMAL LOW (ref 9–20)
BUN: 5 mg/dL — ABNORMAL LOW (ref 6–20)
Bilirubin Total: 0.4 mg/dL (ref 0.0–1.2)
CO2: 24 mmol/L (ref 20–29)
Calcium: 8.8 mg/dL (ref 8.7–10.2)
Chloride: 98 mmol/L (ref 96–106)
Creatinine, Ser: 0.75 mg/dL — ABNORMAL LOW (ref 0.76–1.27)
Globulin, Total: 3.1 g/dL (ref 1.5–4.5)
Glucose: 68 mg/dL — ABNORMAL LOW (ref 70–99)
Potassium: 3.8 mmol/L (ref 3.5–5.2)
Sodium: 138 mmol/L (ref 134–144)
Total Protein: 6.7 g/dL (ref 6.0–8.5)
eGFR: 122 mL/min/{1.73_m2} (ref >59)

## 2022-05-13 ENCOUNTER — Telehealth (HOSPITAL_COMMUNITY): Payer: Self-pay | Admitting: Emergency Medicine

## 2022-05-13 MED ORDER — FUROSEMIDE 40 MG PO TABS: 40.0000 mg | ORAL_TABLET | Freq: Every day | ORAL | 0 refills | Status: AC

## 2022-05-13 MED ORDER — POTASSIUM CHLORIDE CRYS ER 20 MEQ PO TBCR: 20.0000 meq | EXTENDED_RELEASE_TABLET | Freq: Every day | ORAL | 0 refills | Status: AC

## 2023-02-06 ENCOUNTER — Ambulatory Visit (HOSPITAL_BASED_OUTPATIENT_CLINIC_OR_DEPARTMENT_OTHER): Payer: 59 | Admitting: General Surgery

## 2023-02-07 ENCOUNTER — Encounter (HOSPITAL_BASED_OUTPATIENT_CLINIC_OR_DEPARTMENT_OTHER): Payer: Medicaid Other | Attending: General Surgery | Admitting: Internal Medicine

## 2023-02-07 DIAGNOSIS — F1910 Other psychoactive substance abuse, uncomplicated: Secondary | ICD-10-CM

## 2023-02-07 DIAGNOSIS — F1911 Other psychoactive substance abuse, in remission: Secondary | ICD-10-CM | POA: Insufficient documentation

## 2023-02-07 DIAGNOSIS — S41102A Unspecified open wound of left upper arm, initial encounter: Secondary | ICD-10-CM | POA: Diagnosis not present

## 2023-02-07 DIAGNOSIS — J45909 Unspecified asthma, uncomplicated: Secondary | ICD-10-CM | POA: Insufficient documentation

## 2023-02-07 DIAGNOSIS — L98492 Non-pressure chronic ulcer of skin of other sites with fat layer exposed: Secondary | ICD-10-CM | POA: Insufficient documentation

## 2023-02-07 DIAGNOSIS — L03114 Cellulitis of left upper limb: Secondary | ICD-10-CM | POA: Diagnosis not present

## 2023-02-07 DIAGNOSIS — W460XXA Contact with hypodermic needle, initial encounter: Secondary | ICD-10-CM | POA: Insufficient documentation

## 2023-02-08 NOTE — Progress Notes (Signed)
SABATINO, WILLIARD ([id number]) [id number] Page 1 of 7 Visit Report for 02/07/2023 Chief Complaint Document Details Patient Name: Date of Service: [name] THA N 02/07/2023 12:30 PM Medical Record Number: [id number] Patient Account Number: [id number] Date of Birth/Sex: Treating RN: September 03, 1991 (32 y.o. M) Primary Care Provider: PA Zenovia Jordan, NO Other Clinician: Referring Provider: Treating Provider/Extender: Tilda Franco in Treatment: 0 Information Obtained from: Patient Chief Complaint 02/07/2023; left upper extremity wound Electronic Signature(s) Signed: 02/07/2023 2:44:51 PM By: Geralyn Corwin DO Entered By: Geralyn Corwin on 02/07/2023 13:36:29 -------------------------------------------------------------------------------- HPI Details Patient Name: Date of Service: [name] NA THA N 02/07/2023 12:30 PM Medical Record Number: [id number] Patient Account Number: [id number] Date of Birth/Sex: Treating RN: [birth year] (32 y.o. 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 He presents with a 1- 1/2-year history of wound to the left arm. He states this was a result of infection from a previous IV injection site. He has been seen in the ED for this issue on 2 occasions. Last seen 05/10/2022. He is currently been using Neosporin to the area. He reports increased pain over the past month. He has slight increase in warmth and erythema to the periwound. He currently denies purulent drainage. Electronic Signature(s) Signed: 02/07/2023 2:44:51 PM By: Geralyn Corwin DO Entered By: Geralyn Corwin on 02/07/2023  13:37:49 -------------------------------------------------------------------------------- Physical Exam Details Patient Name: Date of Service: [name] NA THA N 02/07/2023 12:30 PM Medical Record Number: [id number] Patient Account Number: [id number] Date of Birth/Sex: Treating RN: 10-01-1991 (32 y.o. M) Primary Care Provider: PA Zenovia Jordan, NO Other Clinician: Referring Provider: Treating Provider/Extender: Tilda Franco in Treatment: 0 Constitutional respirations regular, non-labored and within target range for patient.Marland Kitchen Psychiatric pleasant and cooperative. Notes Left arm: large Open wound to the posterior aspect with granulation tissue and minimal nonviable tissue. Periwound with increased warmth and erythema. Mild tenderness on palpation. No purulent drainage. Sensation intact. Electronic Signature(s) CANNAN, BEECK ([id number]) [id number] Page 2 of 7 Signed: 02/07/2023 2:44:51 PM By: Geralyn Corwin DO Entered By: Geralyn Corwin on 02/07/2023 13:39:15 -------------------------------------------------------------------------------- Physician Orders Details Patient Name: Date of Service: [name] NA THA N 02/07/2023 12:30 PM Medical Record Number: [id number] Patient Account Number: [id number] Date of Birth/Sex: Treating RN: 1991/02/23 (32 y.o. [name] Primary Care Provider: PA Zenovia Jordan, NO Other Clinician: Referring Provider: Treating Provider/Extender: Tilda Franco in Treatment: 0 Verbal / Phone Orders: No Diagnosis Coding Follow-up Appointments ppointment in 2 weeks. - w/ Dr. Mikey Bussing and Maryruth Bun Rm # 9 Thursday 05/02//24 @ 9:15 Return A Anesthetic (In clinic) Topical Lidocaine 5% applied to wound bed Bathing/ Shower/ Hygiene May shower with protection but do not get wound dressing(s) wet. Protect dressing(s) with water repellant cover (for example, large plastic bag) or a cast cover and may then take shower. Wound  Treatment Wound #1 - Forearm Wound Laterality: Left, Anterior Cleanser: Vashe 5.8 (oz) 1 x Per Day/15 Days Discharge Instructions: Cleanse the wound with Vashe prior to applying a clean dressing using gauze sponges, not tissue or cotton balls. Cleanser: Byram Ancillary Kit - 15 Day Supply (DME) (Generic) 1 x Per Day/15 Days Discharge Instructions: Use supplies as instructed; Kit contains: (15) Saline Bullets; (15) 3x3 Gauze; 15 pr Gloves Prim Dressing: vashe wet to  dry 1 x Per Day/15 Days ary Discharge Instructions: moisten gauze with vashe Secondary Dressing: ABD Pad, 5x9 1 x Per Day/15 Days Discharge Instructions: Apply over primary dressing as directed. Secured With: [employer], 4.5x3.1 (in/yd) 1 x Per Day/15 Days Discharge Instructions: Secure with Kerlix as directed. Secured With: 31M Medipore H Soft Cloth Surgical T ape, 4 x 10 (in/yd) 1 x Per Day/15 Days Discharge Instructions: Secure with tape as directed. Secured With: Stretch Net Size 5, 10 (yds) 1 x Per Day/15 Days Consults Plastic Surgery - Consult with Dr. Ladona Ridgel for possible OR debridement/skin sub placement or skin graft. Patient Medications llergies: Vicodin, Xanax A Notifications Medication Indication Start End PRN debridements/pain4/18/2024 lidocaine DOSE topical 5 % gel - gel topical once daily 02/07/2023 doxycycline hyclate DOSE 1 - oral 100 mg tablet - 1 tablet oral twice a day x 14 days 02/07/2023 amoxicillin-pot clavulanate DOSE 1 - oral 875 mg-125 mg tablet - 1 tablet oral twice a day x 14 days Electronic Signature(s) Signed: 02/07/2023 2:44:51 PM By: Geralyn Corwin DO Previous Signature: 02/07/2023 1:42:21 PM Version By: Geralyn Corwin DO Entered By: Geralyn Corwin on 02/07/2023 13:42:30 [name] ([id number]) [id number] Page 3 of 7 Prescription 02/07/2023 [name] -------------------------------------------------------------------------------- ,  Geralyn Corwin DO Patient Name: Provider: [birth year] [id number] Date of Birth: NPI#: Judie Petit [id number] Sex: DEA #: [phone] Phone #: License #: Eligha Bridegroom [facility] Patient Address: [address] [place], [place] [address] [address] [place], [place] [address] [phone] Allergies Vicodin; Xanax Provider's Orders Plastic Surgery - Consult with Dr. Ladona Ridgel for possible OR debridement/skin sub placement or skin graft. Hand Signature: Date(s): Electronic Signature(s) Signed: 02/07/2023 2:44:51 PM By: Geralyn Corwin DO Entered By: Geralyn Corwin on 02/07/2023 13:42:30 -------------------------------------------------------------------------------- Problem List Details Patient Name: Date of Service: [name] NA THA N 02/07/2023 12:30 PM Medical Record Number: [id number] Patient Account Number: [id number] Date of Birth/Sex: Treating RN: November 01, 1990 (32 y.o. M) Primary Care Provider: PA Zenovia Jordan, NO Other Clinician: Referring Provider: Treating Provider/Extender: Tilda Franco in Treatment: 0 Active Problems ICD-10 Encounter Code Description Active Date MDM Diagnosis S41.102A Unspecified open wound of left upper arm, initial encounter 02/07/2023 No Yes L98.492 Non-pressure chronic ulcer of skin of other sites with fat layer exposed 02/07/2023 No Yes L03.114 Cellulitis of left upper limb 02/07/2023 No Yes F19.10 Other psychoactive substance abuse, uncomplicated 02/07/2023 No Yes Inactive Problems Resolved Problems Electronic Signature(s) Signed: 02/07/2023 2:44:51 PM By: Geralyn Corwin DO Entered By: Geralyn Corwin on 02/07/2023 13:36:04 [name] ([id number]) [id number] Page 4 of 7 -------------------------------------------------------------------------------- Progress Note Details Patient Name: Date of Service: [name] THA N 02/07/2023 12:30 PM Medical  Record Number: [id number] Patient Account Number: [id number] Date of Birth/Sex: Treating RN: 06-Jun-1991 (32 y.o. 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 He presents with a 1- 1/2-year history of wound to the left arm. He states this was a result of infection from a previous IV injection site. He has been seen in the ED for this issue on 2 occasions. Last seen 05/10/2022. He is currently been using Neosporin to the area. He reports increased pain over the  past month. He has slight increase in warmth and erythema to the periwound. He currently denies purulent drainage. Patient History Information obtained from Patient. Allergies Vicodin, Xanax Family History Cancer - Father, Hypertension - Father, No family history of Diabetes, Heart Disease, Hereditary Spherocytosis, Kidney Disease, Lung Disease, Seizures, Stroke, Thyroid Problems, Tuberculosis. Social History Current every day smoker - 1/2ppd - started on 01/20/1997, Marital Status - Single, Alcohol Use - Never, Drug Use - Prior History, Caffeine Use - Moderate. Medical History Respiratory Patient has history of Asthma Medical A Surgical History Notes nd Constitutional Symptoms (General Health) methadone dependence hx drug addiction IV drug abuse Musculoskeletal tendonitis Review of Systems (ROS) Constitutional Symptoms (General Health) Denies complaints or symptoms of Fatigue, Fever, Chills, Marked Weight Change. Eyes Complains or has symptoms of Glasses / Contacts. Ear/Nose/Mouth/Throat Denies complaints or symptoms of Chronic sinus problems or  rhinitis. Cardiovascular Denies complaints or symptoms of Chest pain. Gastrointestinal Denies complaints or symptoms of Frequent diarrhea, Nausea, Vomiting. Endocrine Denies complaints or symptoms of Heat/cold intolerance. Genitourinary Denies complaints or symptoms of Frequent urination. Integumentary (Skin) Complains or has symptoms of Wounds - Left forearm. Musculoskeletal Denies complaints or symptoms of Muscle Pain, Muscle Weakness. Neurologic Denies complaints or symptoms of Numbness/parasthesias. Psychiatric Denies complaints or symptoms of Claustrophobia. Objective Constitutional respirations regular, non-labored and within target range for patient.. Vitals Time Taken: 12:39 PM, Height: 75 in, Weight: 185 lbs, BMI: 23.1, Temperature: 98.8 F, Pulse: 77 bpm, Respiratory Rate: 18 breaths/min, Blood Pressure: 122/77 mmHg. BARD, HAUPERT ([id number]) [id number] Page 5 of 7 Psychiatric pleasant and cooperative. General Notes: Left arm: large Open wound to the posterior aspect with granulation tissue and minimal nonviable tissue. Periwound with increased warmth and erythema. Mild tenderness on palpation. No purulent drainage. Sensation intact. Integumentary (Hair, Skin) Wound #1 status is Open. Original cause of wound was Puncture. The date acquired was: 01/20/2022. The wound is located on the Left,Anterior Forearm. The wound measures 19.5cm length x 10cm width x 0.2cm depth; 153.153cm^2 area and 30.631cm^3 volume. There is no tunneling or undermining noted. There is a medium amount of serosanguineous drainage noted. There is medium (34-66%) red, pink granulation within the wound bed. There is a medium (34-66%) amount of necrotic tissue within the wound bed including Adherent Slough. The periwound skin appearance did not exhibit: Callus, Crepitus, Excoriation, Induration, Rash, Scarring, Dry/Scaly, Maceration, Atrophie Blanche, Cyanosis, Ecchymosis,  Hemosiderin Staining, Mottled, Pallor, Rubor, Erythema. Assessment Active Problems ICD-10 Unspecified open wound of left upper arm, initial encounter Non-pressure chronic ulcer of skin of other sites with fat layer exposed Cellulitis of left upper limb Other psychoactive substance abuse, uncomplicated Patient presents with a 1-1/2-year history of nonhealing ulcer to the posterior left arm secondary from infection and IV drug use. He states he has been in remission for the past 10 months. Due to the tenderness and increased warmth and erythema recommended oral antibiotics. I also recommended Vashe wet- to-dry dressings to use daily and as needed. He may also benefit from a plastic surgery referral to discuss potential OR debridement with skin graft versus skin substitute. He was agreeable to this. Follow-up in 2 weeks. Plan Follow-up Appointments: Return Appointment in 2 weeks. - w/ Dr. Mikey Bussing and Maryruth Bun Rm # 9 Thursday 05/02//24 @ 9:15 Anesthetic: (In clinic) Topical Lidocaine 5% applied to wound bed Bathing/ Shower/ Hygiene: May shower with protection but do not get wound dressing(s) wet. Protect dressing(s) with water repellant cover (for example, large plastic bag) or a cast cover and may then take  shower. Consults ordered were: Plastic Surgery - Consult with Dr. Ladona Ridgel for possible OR debridement/skin sub placement or skin graft. The following medication(s) was prescribed: lidocaine topical 5 % gel gel topical once daily for PRN debridements/pain was prescribed at facility doxycycline hyclate oral 100 mg tablet 1 1 tablet oral twice a day x 14 days starting 02/07/2023 amoxicillin-pot clavulanate oral 875 mg-125 mg tablet 1 1 tablet oral twice a day x 14 days starting 02/07/2023 WOUND #1: - Forearm Wound Laterality: Left, Anterior Cleanser: Vashe 5.8 (oz) 1 x Per Day/15 Days Discharge Instructions: Cleanse the wound with Vashe prior to applying a clean dressing using gauze sponges, not  tissue or cotton balls. Cleanser: [employer]ram Ancillary Kit - 15 Day Supply (DME) (Generic) 1 x Per Day/15 Days Discharge Instructions: Use supplies as instructed; Kit contains: (15) Saline Bullets; (15) 3x3 Gauze; 15 pr Gloves Prim Dressing: vashe wet to dry 1 x Per Day/15 Days ary Discharge Instructions: moisten gauze with vashe Secondary Dressing: ABD Pad, 5x9 1 x Per Day/15 Days Discharge Instructions: Apply over primary dressing as directed. Secured With: [employer], 4.5x3.1 (in/yd) 1 x Per Day/15 Days Discharge Instructions: Secure with Kerlix as directed. Secured With: 57M Medipore H Soft Cloth Surgical T ape, 4 x 10 (in/yd) 1 x Per Day/15 Days Discharge Instructions: Secure with tape as directed. Secured With: Stretch Net Size 5, 10 (yds) 1 x Per Day/15 Days 1. Doxycycline and Augmentin 2. Vashe wet-to-dry dressings 3. Follow-up in 2 weeks 4. Plastic surgery referral Electronic Signature(s) Signed: 02/07/2023 2:44:51 PM By: Geralyn Corwin DO Entered By: Geralyn Corwin on 02/07/2023 13:42:38 HxROS Details -------------------------------------------------------------------------------- [name] ([id number]) [id number] Page 6 of 7 Patient Name: Date of Service: [name] THA N 02/07/2023 12:30 PM Medical Record Number: [id number] Patient Account Number: [id number] Date of Birth/Sex: Treating RN: January 01, 1991 (32 y.o. [name] Primary Care Provider: PA Zenovia Jordan, NO Other Clinician: Referring Provider: Treating Provider/Extender: Tilda Franco in Treatment: 0 Information Obtained From Patient Constitutional Symptoms (General Health) Complaints and Symptoms: Negative for: Fatigue; Fever; Chills; Marked Weight Change Medical History: Past Medical History Notes: methadone dependence hx drug addiction IV drug abuse Eyes Complaints and Symptoms: Positive for: Glasses / Contacts Ear/Nose/Mouth/Throat Complaints and  Symptoms: Negative for: Chronic sinus problems or rhinitis Cardiovascular Complaints and Symptoms: Negative for: Chest pain Gastrointestinal Complaints and Symptoms: Negative for: Frequent diarrhea; Nausea; Vomiting Endocrine Complaints and Symptoms: Negative for: Heat/cold intolerance Genitourinary Complaints and Symptoms: Negative for: Frequent urination Integumentary (Skin) Complaints and Symptoms: Positive for: Wounds - Left forearm Musculoskeletal Complaints and Symptoms: Negative for: Muscle Pain; Muscle Weakness Medical History: Past Medical History Notes: tendonitis Neurologic Complaints and Symptoms: Negative for: Numbness/parasthesias Psychiatric Complaints and Symptoms: Negative for: Claustrophobia Hematologic/Lymphatic Respiratory Medical HistoryCLEBERT, [name] ([id number]) [id number] Page 7 of 7 Positive for: Asthma Immunological Oncologic Immunizations Pneumococcal Vaccine: Received Pneumococcal Vaccination: Yes Received Pneumococcal Vaccination On or After 60th Birthday: No Tetanus Vaccine: Last tetanus shot: 01/20/2021 Implantable Devices None Family and Social History Cancer: Yes - Father; Diabetes: No; Heart Disease: No; Hereditary Spherocytosis: No; Hypertension: Yes - Father; Kidney Disease: No; Lung Disease: No; Seizures: No; Stroke: No; Thyroid Problems: No; Tuberculosis: No; Current every day smoker - 1/2ppd - started on 01/20/1997; Marital Status - Single; Alcohol Use: Never; Drug Use: Prior History; Caffeine Use: Moderate; Financial Concerns: No; Food, Clothing or Shelter Needs: No; Support System Lacking: No; Transportation Concerns: No Electronic Signature(s) Signed: 02/07/2023 1:56:54 PM By: Brenton Grills Signed: 02/07/2023 2:41:21 PM By:  Shawn Stall RN, BSN Signed: 02/07/2023 2:44:51 PM By: Geralyn Corwin DO Previous Signature: 02/07/2023 11:07:03 AM Version By: Geralyn Corwin DO Entered By: Brenton Grills on 02/07/2023 12:52:43 -------------------------------------------------------------------------------- SuperBill Details Patient Name: Date of Service: [name] NA THA N 02/07/2023 Medical Record Number: [id number] Patient Account Number: [id number] Date of Birth/Sex: Treating RN: Jul 02, 1991 (32 y.o. M) Primary Care Provider: PA TIENT, NO Other Clinician: Referring Provider: Treating Provider/Extender: Tilda Franco in Treatment: 0 Diagnosis Coding ICD-10 Codes Code Description S41.102A Unspecified open wound of left upper arm, initial encounter L98.492 Non-pressure chronic ulcer of skin of other sites with fat layer exposed L03.114 Cellulitis of left upper limb F19.10 Other psychoactive substance abuse, uncomplicated Facility Procedures : CPT4 Code: [id number] Description: 99213 - WOUND CARE VISIT-LEV 3 EST PT Modifier: Quantity: 1 Physician Procedures : CPT4 Code Description Modifier [id number] - WC PHYS LEVEL 4 - NEW PT ICD-10 Diagnosis Description S41.102A Unspecified open wound of left upper arm, initial encounter L98.492 Non-pressure chronic ulcer of skin of other sites with fat layer exposed  L03.114 Cellulitis of left upper limb F19.10 Other psychoactive substance abuse, uncomplicated Quantity: 1 Electronic Signature(s) Signed: 02/07/2023 2:44:51 PM By: Geralyn Corwin DO Signed: 02/08/2023 11:19:08 AM By: Fonnie Mu RN Entered By: Fonnie Mu on 02/07/2023 14:04:17

## 2023-02-08 NOTE — Progress Notes (Signed)
[name] ([id number]) [id number] Page 1 of 4 Visit Report for 02/07/2023 Abuse Risk Screen Details Patient Name: Date of Service: [name] THA N 02/07/2023 12:30 PM Medical Record Number: [id number] Patient Account Number: [id number] Date of Birth/Sex: Treating RN: 10/23/1990 (32 y.o. M) Primary Care Trisha Ken: PA Zenovia Jordan, NO Other Clinician: Referring Arlando Leisinger: Treating Aileen Amore/Extender: Tilda Franco in Treatment: 0 Abuse Risk Screen Items Answer ABUSE RISK SCREEN: Has anyone close to you tried to hurt or harm you recentlyo No Do you feel uncomfortable with anyone in your familyo No Has anyone forced you do things that you didnt want to doo No Electronic Signature(s) Signed: 02/07/2023 1:56:54 PM By: Brenton Grills Entered By: Brenton Grills on 02/07/2023 12:53:05 -------------------------------------------------------------------------------- Activities of Daily Living Details Patient Name: Date of Service: [name] THA N 02/07/2023 12:30 PM Medical Record Number: [id number] Patient Account Number: [id number] Date of Birth/Sex: Treating RN: 07-Feb-1991 (32 y.o. M) Primary Care Mabrey Howland: PA Zenovia Jordan, NO Other Clinician: Referring Aayana Reinertsen: Treating Jessicca Stitzer/Extender: Tilda Franco in Treatment: 0 Activities of Daily Living Items Answer Activities of Daily Living (Please select one for each item) Drive Automobile Completely Able T Medications ake Completely Able Use T elephone Completely Able Care for Appearance Completely Able Use T oilet Completely Able Bath / Shower Completely Able Dress Self Completely Able Feed Self Completely Able Walk Completely Able Get In / Out Bed Completely Able Housework Completely Able Prepare Meals Completely Able Handle Money Completely Able Shop for Self Completely Able Electronic Signature(s) Signed: 02/08/2023 11:38:03 AM By: Thayer Dallas Entered By: Thayer Dallas on  02/07/2023 12:48:08 -------------------------------------------------------------------------------- Education Screening Details Patient Name: Date of Service: [name] NA THA N 02/07/2023 12:30 PM Medical Record Number: [id number] Patient Account Number: [id number] Date of Birth/Sex: Treating RN: [birth year] (32 y.o. M) Primary Care Juliett Eastburn: PA Zenovia Jordan, NO Other Clinician: Referring Morena Mckissack: Treating Lakynn Halvorsen/Extender: Tilda Franco in TreatmentKYREN, [name] ([id number]) [id number] Page 2 of 4 Primary Learner Assessed: Patient Learning Preferences/Education Level/Primary Language Learning Preference: Explanation, Demonstration, Printed Material Highest Education Level: College or Above Preferred Language: Economist Language Barrier: No Translator Needed: No Memory Deficit: No Emotional Barrier: No Cultural/Religious Beliefs Affecting Medical Care: No Physical Barrier Impaired Vision: Yes Contacts Impaired Hearing: No Decreased Hand dexterity: No Knowledge/Comprehension Knowledge Level: High Comprehension Level: High Ability to understand written instructions: High Ability to understand verbal instructions: High Motivation Anxiety Level: Calm Cooperation: Cooperative Education Importance: Acknowledges Need Interest in Health Problems: Asks Questions Perception: Coherent Willingness to Engage in Self-Management High Activities: Readiness to Engage in Self-Management High Activities: Electronic Signature(s) Signed: 02/08/2023 11:38:03 AM By: Thayer Dallas Entered By: Thayer Dallas on 02/07/2023 12:49:02 -------------------------------------------------------------------------------- Fall Risk Assessment Details Patient Name: Date of Service: [name] NA THA N 02/07/2023 12:30 PM Medical Record Number: [id number] Patient Account Number: [id number] Date of Birth/Sex: Treating RN: [birth year] (32 y.o.  Yates Decamp Primary Care Romanda Turrubiates: PA Zenovia Jordan, NO Other Clinician: Referring Shaqueena Mauceri: Treating Lorelee Mclaurin/Extender: Tilda Franco in Treatment: 0 Fall Risk Assessment Items Have you had 2 or more falls in the last 12 monthso 0 No Have you had any fall that resulted in injury in the last 12 monthso 0 No FALLS RISK SCREEN History of falling - immediate or within 3 months 0 No Secondary diagnosis (Do you have 2 or more medical diagnoseso) 0 No Ambulatory aid None/bed rest/wheelchair/nurse 0 No Crutches/cane/walker 0 No Furniture 0 No Intravenous therapy Access/Saline/Heparin Lock 0 No Gait/Transferring Normal/ bed  rest/ wheelchair 0 No Weak (short steps with or without shuffle, stooped but able to lift head while walking, may seek 0 No support from furniture) Impaired (short steps with shuffle, may have difficulty arising from chair, head down, impaired 0 No balance) Mental Status Oriented to own ability 0 No Overestimates or forgets limitations 0 No Risk Level: Low Risk Score: 0 [name], [name] ([id number]) [phone] [id number] Page 3 of 4 Electronic Signature(s) -------------------------------------------------------------------------------- Foot Assessment Details Patient Name: Date of Service: [name] THA N 02/07/2023 12:30 PM Medical Record Number: [id number] Patient Account Number: [id number] Date of Birth/Sex: Treating RN: May 26, 1991 (32 y.o. M) Primary Care Talani Brazee: PA TIENT, NO Other Clinician: Referring Andreya Lacks: Treating Talma Aguillard/Extender: Tilda Franco in Treatment: 0 Foot Assessment Items Site Locations + = Sensation present, - = Sensation absent, C = Callus, U = Ulcer R = Redness, W = Warmth, M = Maceration, PU = Pre-ulcerative lesion F = Fissure, S = Swelling, D = Dryness Assessment Right: Left: Other Deformity: No No Prior Foot Ulcer: No No Prior Amputation: No No Charcot Joint: No No Ambulatory  Status: Ambulatory Without Help Gait: Steady Notes Wound on arm Electronic Signature(s) Signed: 02/08/2023 11:38:03 AM By: Thayer Dallas Entered By: Thayer Dallas on 02/07/2023 12:49:41 -------------------------------------------------------------------------------- Nutrition Risk Screening Details Patient Name: Date of Service: [name] THA N 02/07/2023 12:30 PM Medical Record Number: [id number] Patient Account Number: [id number] Date of Birth/Sex: Treating RN: 1990/11/30 (32 y.o. Yates Decamp Primary Care Merlina Marchena: PA Zenovia Jordan, NO Other Clinician: Referring Brandace Cargle: Treating Detta Mellin/Extender: Tilda Franco in Treatment: 0 Height (in): 75 Weight (lbs): 185 Body Mass Index (BMI): 23.1 KEEN, EWALT ([id number]) [phone] [id number] Page 4 of 4 Nutrition Risk Screening Items Score Screening NUTRITION RISK SCREEN: I have an illness or condition that made me change the kind and/or amount of food I eat 0 No I eat fewer than two meals per day 0 No I eat few fruits and vegetables, or milk products 0 No I have three or more drinks of beer, liquor or wine almost every day 0 No I have tooth or mouth problems that make it hard for me to eat 0 No I don't always have enough money to buy the food I need 0 No I eat alone most of the time 0 No I take three or more different prescribed or over-the-counter drugs a day 0 No Without wanting to, I have lost or gained 10 pounds in the last six months 0 No I am not always physically able to shop, cook and/or feed myself 0 No Nutrition Protocols Good Risk Protocol Moderate Risk Protocol High Risk Proctocol Risk Level: Good Risk Score: 0 Electronic Signature(s) Signed: 02/07/2023 1:56:54 PM By: Brenton Grills Entered By: Brenton Grills on 02/07/2023 12:58:01

## 2023-02-08 NOTE — Progress Notes (Signed)
[name] ([id number]) [id number] Page 1 of 8 Visit Report for 02/07/2023 Allergy List Details Patient Name: Date of Service: [name] 02/07/2023 12:30 PM Medical Record Number: [id number] Patient Account Number: [id number] Date of Birth/Sex: Treating RN: [name] (32 y.o. [name] Primary Care [name]: PA [name], NO Other Clinician: Referring [name]: Treating [name]/Extender: [name] in Treatment: 0 Allergies Active Allergies Vicodin Xanax Allergy Notes Electronic Signature(s) Signed: 02/07/2023 1:56:54 PM By: [name] Entered By: [name] on 02/07/2023 12:42:39 -------------------------------------------------------------------------------- Arrival Information Details Patient Name: Date of Service: [name] NA THA [name] 02/07/2023 12:30 PM Medical Record Number: [id number] Patient Account Number: [id number] Date of Birth/Sex: Treating RN: [birth year] (32 y.o. [name] Primary Care [name]: PA [name], NO Other Clinician: Referring Camani Sesay: Treating [name]/Extender: [name] in Treatment: 0 Visit Information Patient Arrived: Ambulatory Arrival Time: 12:39 Accompanied By: self Transfer Assistance: None Patient Identification Verified: Yes Secondary Verification Process Completed: Yes Patient Requires Transmission-Based Precautions: No Patient Has Alerts: No Electronic Signature(s) Signed: 02/07/2023 1:56:54 PM By: [name] Entered By: [name] on 02/07/2023 12:39:52 -------------------------------------------------------------------------------- Clinic Level of Care Assessment Details Patient Name: Date of Service: [name] 02/07/2023 12:30 PM Medical Record Number: [id number] Patient Account Number: [id number] Date of Birth/Sex: Treating RN: [name] (32 y.o. [name] Primary Care [name]: PA [name], NO Other  Clinician: Referring [name]: Treating [name]/Extender: [name] in Treatment: 0 Clinic Level of Care Assessment Items TOOL 4 Quantity Score X- 1 0 Use when only an EandM is performed on FOLLOW-UP visit ASSESSMENTS - Nursing Assessment / Reassessment X- 1 10 Reassessment of Co-morbidities (includes updates in patient status) [name], [name] ([id number]) [id number] Page 2 of 8 X- 1 5 Reassessment of Adherence to Treatment Plan ASSESSMENTS - Wound and Skin A ssessment / Reassessment X - Simple Wound Assessment / Reassessment - one wound 1 5 []  - 0 Complex Wound Assessment / Reassessment - multiple wounds []  - 0 Dermatologic / Skin Assessment (not related to wound area) ASSESSMENTS - Focused Assessment []  - 0 Circumferential Edema Measurements - multi extremities []  - 0 Nutritional Assessment / Counseling / Intervention []  - 0 Lower Extremity Assessment (monofilament, tuning fork, pulses) []  - 0 Peripheral Arterial Disease Assessment (using hand held doppler) ASSESSMENTS - Ostomy and/or Continence Assessment and Care []  - 0 Incontinence Assessment and Management []  - 0 Ostomy Care Assessment and Management (repouching, etc.) PROCESS - Coordination of Care X - Simple Patient / Family Education for ongoing care 1 15 []  - 0 Complex (extensive) Patient / Family Education for ongoing care X- 1 10 Staff obtains Chiropractor, Records, T Results / Process Orders est []  - 0 Staff telephones HHA, Nursing Homes / Clarify orders / etc []  - 0 Routine Transfer to another Facility (non-emergent condition) []  - 0 Routine Hospital Admission (non-emergent condition) X- 1 15 New Admissions / Manufacturing engineer / Ordering NPWT Apligraf, etc. , []  - 0 Emergency Hospital Admission (emergent condition) X- 1 10 Simple Discharge Coordination []  - 0 Complex (extensive) Discharge Coordination PROCESS - Special Needs []  - 0 Pediatric / Minor  Patient Management []  - 0 Isolation Patient Management []  - 0 Hearing / Language / Visual special needs []  - 0 Assessment of Community assistance (transportation, D/C planning, etc.) []  - 0 Additional assistance / Altered mentation []  - 0 Support Surface(s) Assessment (bed, cushion, seat, etc.) INTERVENTIONS - Wound Cleansing / Measurement X - Simple Wound Cleansing - one wound  1 5  - 0 Complex Wound Cleansing - multiple wounds X- 1 5 Wound Imaging (photographs - any number of wounds)  - 0 Wound Tracing (instead of photographs) X- 1 5 Simple Wound Measurement - one wound  - 0 Complex Wound Measurement - multiple wounds INTERVENTIONS - Wound Dressings X - Small Wound Dressing one or multiple wounds 1 10  - 0 Medium Wound Dressing one or multiple wounds  - 0 Large Wound Dressing one or multiple wounds X- 1 5 Application of Medications - topical  - 0 Application of Medications - injection [name], [name] ([id number]) [id number] Page 3 of 8 INTERVENTIONS - Miscellaneous  - 0 External ear exam  - 0 Specimen Collection (cultures, biopsies, blood, body fluids, etc.)  - 0 Specimen(s) / Culture(s) sent or taken to Lab for analysis  - 0 Patient Transfer (multiple staff / Michiel Sites Lift / Similar devices)  - 0 Simple Staple / Suture removal (25 or less)  - 0 Complex Staple / Suture removal (26 or more)  - 0 Hypo / Hyperglycemic Management (close monitor of Blood Glucose)  - 0 Ankle / Brachial Index (ABI) - do not check if billed separately X- 1 5 Vital Signs Has the patient been seen at the hospital within the last three years: Yes Total Score: 105 Level Of Care: New/Established - Level 3 Electronic Signature(s) Signed: 02/08/2023 11:19:08 AM By: Kevin Mu RN Entered By: [name] on 02/07/2023 14:04:12 -------------------------------------------------------------------------------- Encounter Discharge  Information Details Patient Name: Date of Service: [name] NA THA [name] 02/07/2023 12:30 PM Medical Record Number: [id number] Patient Account Number: [id number] Date of Birth/Sex: Treating RN: [birth year] (32 y.o. Charlean Merl, Lauren Primary Care Latiffany Harwick: PA [name], NO Other Clinician: Referring Janesha Brissette: Treating Kinsey Cowsert/Extender: [name] in Treatment: 0 Encounter Discharge Information Items Discharge Condition: Stable Ambulatory Status: Ambulatory Discharge Destination: Home Transportation: Private Auto Accompanied By: self Schedule Follow-up Appointment: Yes Clinical Summary of Care: Patient Declined Electronic Signature(s) Signed: 02/08/2023 11:19:08 AM By: Kevin Mu RN Entered By: [name] on 02/07/2023 14:04:43 -------------------------------------------------------------------------------- Lower Extremity Assessment Details Patient Name: Date of Service: [name] NA THA [name] 02/07/2023 12:30 PM Medical Record Number: [id number] Patient Account Number: [id number] Date of Birth/Sex: Treating RN: [birth year][name] (32 y.o. M) Primary Care Yizel Canby: PA [name], NO Other Clinician: Referring Jamason Peckham: Treating Amana Bouska/Extender: [name] in Treatment: 0 Electronic Signature(s) Signed: 02/08/2023 11:38:03 AM By: Thayer Dallas Entered By: Thayer Dallas on 02/07/2023 12:49:48 -------------------------------------------------------------------------------- Multi Wound Chart Details Patient Name: Date of Service: [name] NA THA [name] 02/07/2023 12:30 PM Medical Record Number: [id number] Patient Account Number: [id number] [name], [name] ([id number]) [id number] Page 4 of 8 Date of Birth/Sex: Treating RN: [birth year][name] (32 y.o. M) Primary Care Electa Sterry: PA TIENT, NO Other Clinician: Referring Marek Nghiem: Treating Syble Picco/Extender: [name] in Treatment: 0 Vital Signs Height(in): 75 Pulse(bpm):  77 Weight(lbs): 185 Blood Pressure(mmHg): 122/77 Body Mass Index(BMI): 23.1 Temperature(F): 98.8 Respiratory Rate(breaths/min): 18 [1:Photos:] [[name]/A:[name]/A] Left, Anterior Forearm [name]/A [name]/A Wound Location: Puncture [name]/A [name]/A Wounding Event: Infection - not elsewhere classified [name]/A [name]/A Primary Etiology: Asthma [name]/A [name]/A Comorbid History: 01/20/2022 [name]/A [name]/A Date Acquired: 0 [name]/A [name]/A Weeks of Treatment: Open [name]/A [name]/A Wound Status: No [name]/A [name]/A Wound Recurrence: 19.5x10x0.2 [name]/A [name]/A Measurements L x W x D (cm) 153.153 [name]/A [name]/A A (cm) : rea 30.631 [name]/A [name]/A Volume (cm) : Full Thickness Without Exposed [name]/A [name]/A Classification: Support Structures Medium [name]/A [name]/A Exudate Amount: Serosanguineous [name]/A [name]/A Exudate Type: red, brown [name]/A [name]/A Exudate  Color: Medium (34-66%) [name]/A [name]/A Granulation Amount: Red, Pink [name]/A [name]/A Granulation Quality: Medium (34-66%) [name]/A [name]/A Necrotic Amount: Fascia: No [name]/A [name]/A Exposed Structures: Fat Layer (Subcutaneous Tissue): No Tendon: No Muscle: No Joint: No Bone: No Small (1-33%) [name]/A [name]/A Epithelialization: Excoriation: No [name]/A [name]/A Periwound Skin Texture: Induration: No Callus: No Crepitus: No Rash: No Scarring: No Maceration: No [name]/A [name]/A Periwound Skin Moisture: Dry/Scaly: No Atrophie Blanche: No [name]/A [name]/A Periwound Skin Color: Cyanosis: No Ecchymosis: No Erythema: No Hemosiderin Staining: No Mottled: No Pallor: No Rubor: No Treatment Notes Electronic Signature(s) Signed: 02/07/2023 2:44:51 PM By: Geralyn Corwin DO Entered By: Geralyn Corwin on 02/07/2023 13:36:09 -------------------------------------------------------------------------------- Multi-Disciplinary Care Plan Details Patient Name: Date of Service: [name] NA THA [name] 02/07/2023 12:30 PM [name] ([id number]) [id number] Page 5 of 8 Medical Record Number: [id number] Patient Account Number: [id number] Date of Birth/Sex: Treating RN: [birth year] [name] (32  y.o. [name] Primary Care Nadalie Laughner: PA [name], NO Other Clinician: Referring Brigitt Mcclish: Treating Bijan Ridgley/Extender: [name] in Treatment: 0 Active Inactive Wound/Skin Impairment Nursing Diagnoses: Impaired tissue integrity Goals: Patient/caregiver will verbalize understanding of skin care regimen Date Initiated: 02/07/2023 Target Resolution Date: 03/07/2023 Goal Status: Active Interventions: Assess patient/caregiver ability to obtain necessary supplies Assess patient/caregiver ability to perform ulcer/skin care regimen upon admission and as needed Assess ulceration(s) every visit Provide education on ulcer and skin care Treatment Activities: Skin care regimen initiated : 02/07/2023 Topical wound management initiated : 02/07/2023 Notes: Electronic Signature(s) Signed: 02/07/2023 1:56:54 PM By: [name] Entered By: [name] on 02/07/2023 12:55:44 -------------------------------------------------------------------------------- Pain Assessment Details Patient Name: Date of Service: [name] NA THA [name] 02/07/2023 12:30 PM Medical Record Number: [id number] Patient Account Number: [id number] Date of Birth/Sex: Treating RN: [birth year][name] (32 y.o. [name] Primary Care Ramya Vanbergen: PA [name], NO Other Clinician: Referring Kisean Rollo: Treating Mataeo Ingwersen/Extender: [name] in Treatment: 0 Active Problems Location of Pain Severity and Description of Pain Patient Has Paino Yes Site Locations Pain Location: Pain in Ulcers Duration of the Pain. Constant / Intermittento Constant Rate the pain. Current Pain Level: 5 Worst Pain Level: 8 Least Pain Level: 5 Pain Management and Medication Current Pain Management: [name], [name] ([id number]) [id number] Page 6 of 8 Electronic Signature(s) Signed: 02/07/2023 1:56:54 PM By: [name] Entered By: [name] on 02/07/2023  12:54:25 -------------------------------------------------------------------------------- Patient/Caregiver Education Details Patient Name: Date of Service: [name] 4/18/2024andnbsp12:30 PM Medical Record Number: [id number] Patient Account Number: [id number] Date of Birth/Gender: Treating RN: [name] (32 y.o. [name] Primary Care Physician: PA [name], NO Other Clinician: Referring Physician: Treating Physician/Extender: [name] in Treatment: 0 Education Assessment Education Provided To: Patient Education Topics Provided Smoking and Wound Healing: Handouts: Smoking and Wound Healing Methods: Printed Responses: Reinforcements needed, State content correctly Wound/Skin Impairment: Methods: Explain/Verbal Responses: Reinforcements needed, State content correctly Electronic Signature(s) Signed: 02/07/2023 1:56:54 PM By: [name] Entered By: [name] on 02/07/2023 12:56:55 -------------------------------------------------------------------------------- Wound Assessment Details Patient Name: Date of Service: [name] NA THA [name] 02/07/2023 12:30 PM Medical Record Number: [id number] Patient Account Number: [id number] Date of Birth/Sex: Treating RN: [birth year] [name] (32 y.o. M) Primary Care Tkai Serfass: PA [name], NO Other Clinician: Referring Slayter Moorhouse: Treating Evadean Sproule/Extender: [name] in Treatment: 0 Wound Status Wound Number: 1 Primary Etiology: Infection - not elsewhere classified Wound Location: Left, Anterior Forearm Wound Status: Open Wounding Event: Puncture Comorbid History: Asthma Date Acquired: 01/20/2022 Weeks Of Treatment: 0 Clustered Wound: No Photos Wound Measurements [name], [name] ([id number]) Length: (  cm) 19.5 Width: (cm) 10 Depth: (cm) 0.2 Area: (cm) 153.153 Volume: (cm) [id number].pdf Page 7 of 8 % Reduction in Area: % Reduction in Volume: Epithelialization:  Small (1-33%) Tunneling: No Undermining: No Wound Description Classification: Full Thickness Without Exposed Support Structures Exudate Amount: Medium Exudate Type: Serosanguineous Exudate Color: red, brown Foul Odor After Cleansing: No Slough/Fibrino No Wound Bed Granulation Amount: Medium (34-66%) Exposed Structure Granulation Quality: Red, Pink Fascia Exposed: No Necrotic Amount: Medium (34-66%) Fat Layer (Subcutaneous Tissue) Exposed: No Necrotic Quality: Adherent Slough Tendon Exposed: No Muscle Exposed: No Joint Exposed: No Bone Exposed: No Periwound Skin Texture Texture Color No Abnormalities Noted: No No Abnormalities Noted: No Callus: No Atrophie Blanche: No Crepitus: No Cyanosis: No Excoriation: No Ecchymosis: No Induration: No Erythema: No Rash: No Hemosiderin Staining: No Scarring: No Mottled: No Pallor: No Moisture Rubor: No No Abnormalities Noted: No Dry / Scaly: No Maceration: No Treatment Notes Wound #1 (Forearm) Wound Laterality: Left, Anterior Cleanser Vashe 5.8 (oz) Discharge Instruction: Cleanse the wound with Vashe prior to applying a clean dressing using gauze sponges, not tissue or cotton balls. [employer] Ancillary Kit - 15 Day Supply Discharge Instruction: Use supplies as instructed; Kit contains: (15) Saline Bullets; (15) 3x3 Gauze; 15 pr Gloves Peri-Wound Care Topical Primary Dressing vashe wet to dry Discharge Instruction: moisten gauze with vashe Secondary Dressing ABD Pad, 5x9 Discharge Instruction: Apply over primary dressing as directed. Secured With [employer], 4.5x3.1 (in/yd) Discharge Instruction: Secure with Kerlix as directed. 55M Medipore H Soft Cloth Surgical T ape, 4 x 10 (in/yd) Discharge Instruction: Secure with tape as directed. Stretch Net Size 5, 10 (yds) Compression Wrap Compression Stockings Add-Ons Electronic Signature(s) Signed: 02/08/2023 11:38:03 AM By: Thayer Dallas Entered By: Thayer Dallas  on 02/07/2023 12:54:39 [name] ([id number]) [id number] Page 8 of 8 -------------------------------------------------------------------------------- Vitals Details Patient Name: Date of Service: [name] 02/07/2023 12:30 PM Medical Record Number: [id number] Patient Account Number: [id number] Date of Birth/Sex: Treating RN: [name] (32 y.o. [name] Primary Care Lenell Mcconnell: PA [name], NO Other Clinician: Referring Lumen Brinlee: Treating Toniesha Zellner/Extender: [name] in Treatment: 0 Vital Signs Time Taken: 12:39 Temperature (F): 98.8 Height (in): 75 Pulse (bpm): 77 Weight (lbs): 185 Respiratory Rate (breaths/min): 18 Body Mass Index (BMI): 23.1 Blood Pressure (mmHg): 122/77 Reference Range: 80 - 120 mg / dl Electronic Signature(s) Signed: 02/07/2023 1:56:54 PM By: [name] Entered By: [name] on 02/07/2023 12:42:18

## 2023-02-21 ENCOUNTER — Encounter (HOSPITAL_BASED_OUTPATIENT_CLINIC_OR_DEPARTMENT_OTHER): Payer: Medicaid Other | Attending: Internal Medicine | Admitting: Internal Medicine

## 2023-02-21 DIAGNOSIS — S41102A Unspecified open wound of left upper arm, initial encounter: Secondary | ICD-10-CM | POA: Insufficient documentation

## 2023-02-21 DIAGNOSIS — L98492 Non-pressure chronic ulcer of skin of other sites with fat layer exposed: Secondary | ICD-10-CM | POA: Insufficient documentation

## 2023-02-21 DIAGNOSIS — F17210 Nicotine dependence, cigarettes, uncomplicated: Secondary | ICD-10-CM | POA: Diagnosis not present

## 2023-02-21 DIAGNOSIS — F1910 Other psychoactive substance abuse, uncomplicated: Secondary | ICD-10-CM | POA: Diagnosis not present

## 2023-02-21 DIAGNOSIS — L03114 Cellulitis of left upper limb: Secondary | ICD-10-CM | POA: Insufficient documentation

## 2023-02-21 DIAGNOSIS — J45909 Unspecified asthma, uncomplicated: Secondary | ICD-10-CM | POA: Diagnosis not present

## 2023-02-21 DIAGNOSIS — X58XXXA Exposure to other specified factors, initial encounter: Secondary | ICD-10-CM | POA: Diagnosis not present

## 2023-02-23 NOTE — Progress Notes (Signed)
ISAIAHS, FRANKO ([id number]) [id number] Page 1 of 7 Visit Report for 02/21/2023 Arrival Information Details Patient Name: Date of Service: [name] [place] THA N 02/21/2023 9:15 A M Medical Record Number: [id number] Patient Account Number: [id number] Date of Birth/Sex: Treating RN: 1991-09-17 (32 y.o. Charlean Merl, Lauren Primary Care Karolynn Infantino: PA Zenovia Jordan, NO Other Clinician: Referring Reighn Kaplan: Treating Schuyler Olden/Extender: Tilda Franco in Treatment: 2 Visit Information History Since Last Visit Added or deleted any medications: No Patient Arrived: Ambulatory Any new allergies or adverse reactions: No Arrival Time: 09:34 Had a fall or experienced change in No Accompanied By: self activities of daily living that may affect Transfer Assistance: None risk of falls: Patient Identification Verified: Yes Signs or symptoms of abuse/neglect since last visito No Secondary Verification Process Completed: Yes Hospitalized since last visit: No Patient Requires Transmission-Based Precautions: No Implantable device outside of the clinic excluding No Patient Has Alerts: No cellular tissue based products placed in the center since last visit: Has Dressing in Place as Prescribed: Yes Pain Present Now: Yes Electronic Signature(s) Signed: 02/22/2023 9:13:40 AM By: Fonnie Mu RN Entered By: Fonnie Mu on 02/21/2023 09:34:30 -------------------------------------------------------------------------------- Encounter Discharge Information Details Patient Name: Date of Service: [name] NA THA N 02/21/2023 9:15 A M Medical Record Number: [id number] Patient Account Number: [id number] Date of Birth/Sex: Treating RN: 03-30-1991 (33 y.o. Charlean Merl, Lauren Primary Care Winford Hehn: PA Zenovia Jordan, NO Other Clinician: Referring Joseluis Alessio: Treating Anderson Coppock/Extender: Tilda Franco in Treatment: 2 Encounter Discharge Information Items Post Procedure  Vitals Discharge Condition: Stable Temperature (F): 98.7 Ambulatory Status: Ambulatory Pulse (bpm): 74 Discharge Destination: Home Respiratory Rate (breaths/min): 17 Transportation: Private Auto Blood Pressure (mmHg): 120/80 Accompanied By: self Schedule Follow-up Appointment: Yes Clinical Summary of Care: Patient Declined Electronic Signature(s) Signed: 02/22/2023 9:13:40 AM By: Fonnie Mu RN Entered By: Fonnie Mu on 02/21/2023 10:12:33 -------------------------------------------------------------------------------- Lower Extremity Assessment Details Patient Name: Date of Service: [name] NA THA N 02/21/2023 9:15 A M Medical Record Number: [id number] Patient Account Number: [id number] Date of Birth/Sex: Treating RN: Jan 18, 1991 (32 y.o. Lucious Groves Primary Care Lola Czerwonka: PA Fidela Juneau Other Clinician: Referring Allysa Governale: Treating Jayven Naill/Extender: Tilda Franco in Treatment: 2 Electronic Signature(s) Signed: 02/22/2023 9:13:40 AM By: Fonnie Mu RN Raford Pitcher, Christiane Ha ([id number]) [id number] Page 2 of 7 Entered By: Fonnie Mu on 02/21/2023 09:32:23 -------------------------------------------------------------------------------- Multi Wound Chart Details Patient Name: Date of Service: Alden Hipp THA N 02/21/2023 9:15 A M Medical Record Number: [id number] Patient Account Number: [id number] Date of Birth/Sex: Treating RN: 1991-08-09 (32 y.o. M) Primary Care Tajanae Guilbault: PA TIENT, NO Other Clinician: Referring Meleena Munroe: Treating Jaeger Trueheart/Extender: Tilda Franco in Treatment: 2 Vital Signs Height(in): 75 Pulse(bpm): 67 Weight(lbs): 185 Blood Pressure(mmHg): 122/69 Body Mass Index(BMI): 23.1 Temperature(F): 98.4 Respiratory Rate(breaths/min): 17 [1:Photos:] [N/A:N/A] Left, Anterior Forearm N/A N/A Wound Location: Puncture N/A N/A Wounding Event: Infection - not elsewhere classified N/A  N/A Primary Etiology: Asthma N/A N/A Comorbid History: 01/20/2022 N/A N/A Date Acquired: 2 N/A N/A Weeks of Treatment: Open N/A N/A Wound Status: No N/A N/A Wound Recurrence: 19x8.7x0.2 N/A N/A Measurements L x W x D (cm) 129.826 N/A N/A A (cm) : rea 25.965 N/A N/A Volume (cm) : 15.20% N/A N/A % Reduction in A rea: 15.20% N/A N/A % Reduction in Volume: Full Thickness Without Exposed N/A N/A Classification: Support Structures Medium N/A N/A Exudate A mount: Serosanguineous N/A N/A Exudate Type: red, brown N/A N/A Exudate Color: Medium (34-66%) N/A N/A Granulation A mount: Red, Pink N/A N/A Granulation Quality:  Medium (34-66%) N/A N/A Necrotic A mount: Fascia: No N/A N/A Exposed Structures: Fat Layer (Subcutaneous Tissue): No Tendon: No Muscle: No Joint: No Bone: No Small (1-33%) N/A N/A Epithelialization: Debridement - Selective/Open Wound N/A N/A Debridement: Pre-procedure Verification/Time Out 10:00 N/A N/A Taken: Lidocaine N/A N/A Pain Control: Slough N/A N/A Tissue Debrided: Non-Viable Tissue N/A N/A Level: 129.76 N/A N/A Debridement A (sq cm): rea Curette N/A N/A Instrument: Minimum N/A N/A Bleeding: Pressure N/A N/A Hemostasis A chieved: 0 N/A N/A Procedural Pain: 0 N/A N/A Post Procedural Pain: Procedure was tolerated well N/A N/A Debridement Treatment Response: 19x8.7x0.2 N/A N/A Post Debridement Measurements L x W x D (cm) 25.965 N/A N/A Post Debridement Volume: (cm) Excoriation: No N/A N/A Periwound Skin Texture: Induration: No Callus: No Crepitus: No Rash: No ASHLEY, POLACEK ([id number]) [id number] Page 3 of 7 Scarring: No Maceration: No N/A N/A Periwound Skin Moisture: Dry/Scaly: No Atrophie Blanche: No N/A N/A Periwound Skin Color: Cyanosis: No Ecchymosis: No Erythema: No Hemosiderin Staining: No Mottled: No Pallor: No Rubor: No Debridement N/A N/A Procedures  Performed: Treatment Notes Wound #1 (Forearm) Wound Laterality: Left, Anterior Cleanser Vashe 5.8 (oz) Discharge Instruction: Cleanse the wound with Vashe prior to applying a clean dressing using gauze sponges, not tissue or cotton balls. Byram Ancillary Kit - 15 Day Supply Discharge Instruction: Use supplies as instructed; Kit contains: (15) Saline Bullets; (15) 3x3 Gauze; 15 pr Gloves Peri-Wound Care Topical Primary Dressing vashe wet to dry Discharge Instruction: moisten gauze with vashe Secondary Dressing ABD Pad, 5x9 Discharge Instruction: Apply over primary dressing as directed. Secured With [employer], 4.5x3.1 (in/yd) Discharge Instruction: Secure with Kerlix as directed. 55M Medipore H Soft Cloth Surgical T ape, 4 x 10 (in/yd) Discharge Instruction: Secure with tape as directed. Stretch Net Size 5, 10 (yds) Compression Wrap Compression Stockings Add-Ons Electronic Signature(s) Signed: 02/21/2023 11:49:34 AM By: Geralyn Corwin DO Entered By: Geralyn Corwin on 02/21/2023 11:08:08 -------------------------------------------------------------------------------- Multi-Disciplinary Care Plan Details Patient Name: Date of Service: Driscilla Grammes [employer] NA THA N 02/21/2023 9:15 A M Medical Record Number: [id number] Patient Account Number: [id number] Date of Birth/Sex: Treating RN: [birth year] (32 y.o. Lucious Groves Primary Care Markisha Meding: PA Zenovia Jordan, NO Other Clinician: Referring Nicolena Schurman: Treating Venie Montesinos/Extender: Tilda Franco in Treatment: 2 Active Inactive Wound/Skin Impairment Nursing Diagnoses: Impaired tissue integrity Goals: Patient/caregiver will verbalize understanding of skin care regimen Date Initiated: 02/07/2023 Target Resolution Date: 03/07/2023 LAVORIS, RICKETSON ([id number]) [phone].pdf Page 4 of 7 Goal Status: Active Interventions: Assess patient/caregiver ability to obtain necessary supplies Assess  patient/caregiver ability to perform ulcer/skin care regimen upon admission and as needed Assess ulceration(s) every visit Provide education on ulcer and skin care Treatment Activities: Skin care regimen initiated : 02/07/2023 Topical wound management initiated : 02/07/2023 Notes: Electronic Signature(s) Signed: 02/22/2023 9:13:40 AM By: Fonnie Mu RN Entered By: Fonnie Mu on 02/21/2023 09:59:16 -------------------------------------------------------------------------------- Pain Assessment Details Patient Name: Date of Service: [name] NA THA N 02/21/2023 9:15 A M Medical Record Number: [id number] Patient Account Number: [id number] Date of Birth/Sex: Treating RN: 12/14/1990 (32 y.o. Lucious Groves Primary Care Cordale Manera: PA Zenovia Jordan, NO Other Clinician: Referring Cap Massi: Treating Jared Whorley/Extender: Tilda Franco in Treatment: 2 Active Problems Location of Pain Severity and Description of Pain Patient Has Paino Yes Site Locations Pain Location: Pain in Ulcers With Dressing Change: Yes Duration of the Pain. Constant / Intermittento Intermittent Rate the pain. Current Pain Level: 3 Worst Pain Level: 10 Least Pain Level: 0 Tolerable Pain Level: 3 Character of Pain  Describe the Pain: Aching Pain Management and Medication Current Pain Management: Medication: No Cold Application: No Rest: No Massage: No Activity: No T.E.N.S.: No Heat Application: No Leg drop or elevation: No Is the Current Pain Management Adequate: Adequate How does your wound impact your activities of daily livingo Sleep: No Bathing: No Appetite: No Relationship With Others: No Bladder Continence: No Emotions: No Bowel Continence: No Work: No Toileting: No Drive: No Dressing: No Hobbies: No Electronic Signature(sKEISTON, BELLAR ([id number]) [id number].pdf Page 5 of 7 Signed: 02/22/2023 9:13:40 AM By: Fonnie Mu RN Entered By: Fonnie Mu on 02/21/2023 09:32:53 -------------------------------------------------------------------------------- Patient/Caregiver Education Details Patient Name: Date of Service: Alden Hipp THA N 5/2/2024andnbsp9:15 A M Medical Record Number: [id number] Patient Account Number: [id number] Date of Birth/Gender: Treating RN: April 28, 1991 (32 y.o. Lucious Groves Primary Care Physician: PA Zenovia Jordan, NO Other Clinician: Referring Physician: Treating Physician/Extender: Tilda Franco in Treatment: 2 Education Assessment Education Provided To: Patient Education Topics Provided Wound/Skin Impairment: Methods: Explain/Verbal Responses: Reinforcements needed, State content correctly Electronic Signature(s) Signed: 02/22/2023 9:13:40 AM By: Fonnie Mu RN Entered By: Fonnie Mu on 02/21/2023 10:00:17 -------------------------------------------------------------------------------- Wound Assessment Details Patient Name: Date of Service: [name] NA THA N 02/21/2023 9:15 A M Medical Record Number: [id number] Patient Account Number: [id number] Date of Birth/Sex: Treating RN: [birth year] (32 y.o. Charlean Merl, Lauren Primary Care Deshonda Cryderman: PA TIENT, NO Other Clinician: Referring Iridiana Fonner: Treating Chinonso Linker/Extender: Tilda Franco in Treatment: 2 Wound Status Wound Number: 1 Primary Etiology: Infection - not elsewhere classified Wound Location: Left, Anterior Forearm Wound Status: Open Wounding Event: Puncture Comorbid History: Asthma Date Acquired: 01/20/2022 Weeks Of Treatment: 2 Clustered Wound: No Photos Wound Measurements Length: (cm) 19 Width: (cm) 8.7 Depth: (cm) 0.2 Area: (cm) 129.826 Volume: (cm) 25.965 % Reduction in Area: 15.2% % Reduction in Volume: 15.2% Epithelialization: Small (1-33%) Tunneling: No Undermining: No Wound Description ORYON, BRUGGEMAN ([id number]) Classification: Full Thickness Without Exposed Support  Structures Exudate Amount: Medium Exudate Type: Serosanguineous Exudate Color: red, brown [phone].pdf Page 6 of 7 Foul Odor After Cleansing: No Slough/Fibrino No Wound Bed Granulation Amount: Medium (34-66%) Exposed Structure Granulation Quality: Red, Pink Fascia Exposed: No Necrotic Amount: Medium (34-66%) Fat Layer (Subcutaneous Tissue) Exposed: No Necrotic Quality: Adherent Slough Tendon Exposed: No Muscle Exposed: No Joint Exposed: No Bone Exposed: No Periwound Skin Texture Texture Color No Abnormalities Noted: No No Abnormalities Noted: No Callus: No Atrophie Blanche: No Crepitus: No Cyanosis: No Excoriation: No Ecchymosis: No Induration: No Erythema: No Rash: No Hemosiderin Staining: No Scarring: No Mottled: No Pallor: No Moisture Rubor: No No Abnormalities Noted: No Dry / Scaly: No Maceration: No Treatment Notes Wound #1 (Forearm) Wound Laterality: Left, Anterior Cleanser Vashe 5.8 (oz) Discharge Instruction: Cleanse the wound with Vashe prior to applying a clean dressing using gauze sponges, not tissue or cotton balls. Byram Ancillary Kit - 15 Day Supply Discharge Instruction: Use supplies as instructed; Kit contains: (15) Saline Bullets; (15) 3x3 Gauze; 15 pr Gloves Peri-Wound Care Topical Primary Dressing vashe wet to dry Discharge Instruction: moisten gauze with vashe Secondary Dressing ABD Pad, 5x9 Discharge Instruction: Apply over primary dressing as directed. Secured With [employer], 4.5x3.1 (in/yd) Discharge Instruction: Secure with Kerlix as directed. 19M Medipore H Soft Cloth Surgical T ape, 4 x 10 (in/yd) Discharge Instruction: Secure with tape as directed. Stretch Net Size 5, 10 (yds) Compression Wrap Compression Stockings Add-Ons Electronic Signature(s) Signed: 02/22/2023 9:13:40 AM By: Fonnie Mu RN Entered By: Fonnie Mu on 02/21/2023  09:44:42 --------------------------------------------------------------------------------  Vitals Details Patient Name: Date of Service: Alden Hipp THA N 02/21/2023 9:15 A M Medical Record Number: [id number] Patient Account Number: [id number] KISHAN, DODDRIDGE ([id number]) [phone].pdf Page 7 of 7 Date of Birth/Sex: Treating RN: 08/25/1991 (32 y.o. Charlean Merl, Lauren Primary Care Rhian Funari: PA TIENT, NO Other Clinician: Referring Japji Kok: Treating Dechelle Attaway/Extender: Tilda Franco in Treatment: 2 Vital Signs Time Taken: 09:33 Temperature (F): 98.4 Height (in): 75 Pulse (bpm): 67 Weight (lbs): 185 Respiratory Rate (breaths/min): 17 Body Mass Index (BMI): 23.1 Blood Pressure (mmHg): 122/69 Reference Range: 80 - 120 mg / dl Electronic Signature(s) Signed: 02/22/2023 9:13:40 AM By: Fonnie Mu RN Entered By: Fonnie Mu on 02/21/2023 09:34:09

## 2023-02-23 NOTE — Progress Notes (Signed)
OKLEY, MCGAREY ([id number]) [id number] Page 1 of 7 Visit Report for 02/21/2023 Chief Complaint Document Details Patient Name: Date of Service: [name] [place] THA N 02/21/2023 9:15 A M Medical Record Number: [id number] Patient Account Number: [id number] Date of Birth/Sex: Treating RN: Apr 08, 1991 (32 y.o. M) Primary Care Provider: PA Zenovia Jordan, NO Other Clinician: Referring Provider: Treating Provider/Extender: Tilda Franco in Treatment: 2 Information Obtained from: Patient Chief Complaint 02/07/2023; left upper extremity wound Electronic Signature(s) Signed: 02/21/2023 11:49:34 AM By: Geralyn Corwin DO Entered By: Geralyn Corwin on 02/21/2023 11:08:14 -------------------------------------------------------------------------------- Debridement Details Patient Name: Date of Service: [name] NA THA N 02/21/2023 9:15 A M Medical Record Number: [id number] Patient Account Number: [id number] Date of Birth/Sex: Treating RN: 05-Feb-1991 (32 y.o. [name], [name] Primary Care Provider: PA Zenovia Jordan, NO Other Clinician: Referring Provider: Treating Provider/Extender: Tilda Franco in Treatment: 2 Debridement Performed for Assessment: Wound #1 Left,Anterior Forearm Performed By: Physician Geralyn Corwin, DO Debridement Type: Debridement Level of Consciousness (Pre-procedure): Awake and Alert Pre-procedure Verification/Time Out Yes - 10:00 Taken: Start Time: 10:00 Pain Control: Lidocaine Percent of Wound Bed Debrided: 100% T Area Debrided (cm): otal 129.76 Tissue and other material debrided: Viable, Non-Viable, Slough, Slough Level: Non-Viable Tissue Debridement Description: Selective/Open Wound Instrument: Curette Bleeding: Minimum Hemostasis Achieved: Pressure End Time: 10:00 Procedural Pain: 0 Post Procedural Pain: 0 Response to Treatment: Procedure was tolerated well Level of Consciousness (Post- Awake and Alert procedure): Post  Debridement Measurements of Total Wound Length: (cm) 19 Width: (cm) 8.7 Depth: (cm) 0.2 Volume: (cm) 25.965 Character of Wound/Ulcer Post Debridement: Improved Post Procedure Diagnosis Same as Pre-procedure Electronic Signature(s) Signed: 02/21/2023 11:49:34 AM By: Geralyn Corwin DO Signed: 02/22/2023 9:13:40 AM By: Fonnie Mu RN Entered By: Fonnie Mu on 02/21/2023 10:01:18 [name] ([id number]) [id number] Page 2 of 7 -------------------------------------------------------------------------------- HPI Details Patient Name: Date of Service: [name] THA N 02/21/2023 9:15 A M Medical Record Number: [id number] Patient Account Number: [id number] Date of Birth/Sex: Treating RN: 1991/09/25 (32 y.o. 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 He presents with a 1- 1/2-year history of wound to the left arm. He states this was a result of infection from a previous IV injection site. He has been seen in the ED for this issue on 2 occasions. Last seen 05/10/2022. He is currently been using Neosporin to the area. He reports increased pain over the past month. He has slight increase in warmth and erythema to the periwound. He currently denies purulent drainage. 5/2; patient presents for follow-up. He states he has not started Vashe until the last couple days as he was not able to obtain it due to not having money. He has been taking his antibiotics as prescribed. He reports improvement in his symptoms including increased swelling and pain. He does still have some areas with pain and redness however overall has improved. He states he is scheduled to see Dr. Karie Schwalbe aylor  with plastic surgery at the end of the month. Electronic Signature(s) Signed: 02/21/2023 11:49:34 AM By: Geralyn Corwin DO Entered By: Geralyn Corwin on 02/21/2023 11:09:19 -------------------------------------------------------------------------------- Physical Exam Details Patient Name: Date of Service: [name] NA THA N 02/21/2023 9:15 A M Medical Record Number: [id number] Patient Account Number: [id number] Date of  Birth/Sex: Treating RN: 01-17-1991 (32 y.o. M) Primary Care Provider: PA Zenovia Jordan, NO Other Clinician: Referring Provider: Treating Provider/Extender: Tilda Franco in Treatment: 2 Constitutional respirations regular, non-labored and within target range for patient.Marland Kitchen Psychiatric pleasant and cooperative. Notes Left arm: Large open wound to the posterior aspect with granulation tissue and nonviable tissue. Periwound with minimal warmth, erythema or swelling. Still tender to palpation. No purulent drainage noted. Sensation intact. Electronic Signature(s) Signed: 02/21/2023 11:49:34 AM By: Geralyn Corwin DO Entered By: Geralyn Corwin on 02/21/2023 11:10:04 -------------------------------------------------------------------------------- Physician Orders Details Patient Name: Date of Service: [name] NA THA N 02/21/2023 9:15 A M Medical Record Number: [id number] Patient Account Number: [id number] Date of Birth/Sex: Treating RN: 02/20/1991 (32 y.o. [name] Primary Care Provider: PA Zenovia Jordan, NO Other Clinician: Referring Provider: Treating Provider/Extender: Tilda Franco in Treatment: 2 Verbal / Phone Orders: No Diagnosis Coding Follow-up Appointments ppointment in 2 weeks. - w/ Dr. Mikey Bussing and [place] Rm # 9 Thursday 03/07/23 @ 9:30 Return A Other: - Plastic Surgery appt. in a couple of weeks. Be sure to make that appt. [name], [name] ([id number]) [id number] Page 3 of 7 Anesthetic (In clinic) Topical  Lidocaine 5% applied to wound bed Bathing/ Shower/ Hygiene May shower with protection but do not get wound dressing(s) wet. Protect dressing(s) with water repellant cover (for example, large plastic bag) or a cast cover and may then take shower. Wound Treatment Wound #1 - Forearm Wound Laterality: Left, Anterior Cleanser: Vashe 5.8 (oz) 1 x Per Day/15 Days Discharge Instructions: Cleanse the wound with Vashe prior to applying a clean dressing using gauze sponges, not tissue or cotton balls. Cleanser: Byram Ancillary Kit - 15 Day Supply (Generic) 1 x Per Day/15 Days Discharge Instructions: Use supplies as instructed; Kit contains: (15) Saline Bullets; (15) 3x3 Gauze; 15 pr Gloves Prim Dressing: vashe wet to dry 1 x Per Day/15 Days ary Discharge Instructions: moisten gauze with vashe Secondary Dressing: ABD Pad, 5x9 1 x Per Day/15 Days Discharge Instructions: Apply over primary dressing as directed. Secured With: [employer], 4.5x3.1 (in/yd) 1 x Per Day/15 Days Discharge Instructions: Secure with Kerlix as directed. Secured With: 67M Medipore H Soft Cloth Surgical T ape, 4 x 10 (in/yd) 1 x Per Day/15 Days Discharge Instructions: Secure with tape as directed. Secured With: Stretch Net Size 5, 10 (yds) 1 x Per Day/15 Days Patient Medications llergies: Vicodin, Xanax A Notifications Medication Indication Start End 02/21/2023 amoxicillin-pot clavulanate DOSE 1 - oral 875 mg-125 mg tablet - 1 tablet oral twice a day x 7 days Electronic Signature(s) Signed: 02/21/2023 11:49:34 AM By: Geralyn Corwin DO Previous Signature: 02/21/2023 11:13:30 AM Version By: Geralyn Corwin DO Entered By: Geralyn Corwin on 02/21/2023 11:13:40 -------------------------------------------------------------------------------- Problem List Details Patient Name: Date of Service: [name] NA THA N 02/21/2023 9:15 A M Medical Record Number: [id number] Patient Account Number: [id number] Date of Birth/Sex:  Treating RN: 10-Mar-1991 (32 y.o. M) Primary Care Provider: PA Zenovia Jordan, NO Other Clinician: Referring Provider: Treating Provider/Extender: Tilda Franco in Treatment: 2 Active Problems ICD-10 Encounter Code Description Active Date MDM Diagnosis S41.102A Unspecified open wound of left upper arm, initial encounter 02/07/2023 No Yes L98.492 Non-pressure chronic ulcer of skin of other sites with fat layer exposed 02/07/2023 No Yes L03.114 Cellulitis of left upper limb 02/07/2023 No Yes F19.10 Other psychoactive substance abuse, uncomplicated 02/07/2023 No Yes [name], [name] ([id number]) [phone].pdf Page 4 of 7 Inactive Problems Resolved Problems Electronic Signature(s) Signed: 02/21/2023 11:49:34 AM By: Geralyn Corwin DO  Entered By: Geralyn Corwin on 02/21/2023 11:07:54 -------------------------------------------------------------------------------- Progress Note Details Patient Name: Date of Service: [name] THA N 02/21/2023 9:15 A M Medical Record Number: [id number] Patient Account Number: [id number] Date of Birth/Sex: Treating RN: 1991/09/02 (32 y.o. 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 He presents with a 1- 1/2-year history of wound to the left arm. He states this was a result of infection from a previous IV injection site. He has been seen in the ED for this issue on 2 occasions. Last seen 05/10/2022. He is currently been using Neosporin to the area. He reports increased pain over the past month. He has slight increase in warmth and erythema to the periwound. He  currently denies purulent drainage. 5/2; patient presents for follow-up. He states he has not started Vashe until the last couple days as he was not able to obtain it due to not having money. He has been taking his antibiotics as prescribed. He reports improvement in his symptoms including increased swelling and pain. He does still have some areas with pain and redness however overall has improved. He states he is scheduled to see Dr. Karie Schwalbe aylor with plastic surgery at the end of the month. Patient History Information obtained from Patient. Family History Cancer - Father, Hypertension - Father, No family history of Diabetes, Heart Disease, Hereditary Spherocytosis, Kidney Disease, Lung Disease, Seizures, Stroke, Thyroid Problems, Tuberculosis. Social History Current every day smoker - 1/2ppd - started on 01/20/1997, Marital Status - Single, Alcohol Use - Never, Drug Use - Prior History, Caffeine Use - Moderate. Medical History Respiratory Patient has history of Asthma Medical A Surgical History Notes nd Constitutional Symptoms (General Health) methadone dependence hx drug addiction IV drug abuse Musculoskeletal tendonitis Objective Constitutional respirations regular, non-labored and within target range for patient.. Vitals Time Taken: 9:33 AM, Height: 75 in, Weight: 185 lbs, BMI: 23.1, Temperature: 98.4 F, Pulse: 67 bpm, Respiratory Rate: 17 breaths/min, Blood Pressure: 122/69 mmHg. Psychiatric pleasant and cooperative. [name], [name] ([id number]) [id number] Page 5 of 7 General Notes: Left arm: Large open wound to the posterior aspect with granulation tissue and nonviable tissue. Periwound with minimal warmth, erythema or swelling. Still tender to palpation. No purulent drainage noted. Sensation intact. Integumentary (Hair, Skin) Wound #1 status is Open. Original cause of wound was Puncture. The date acquired was: 01/20/2022. The wound has been in treatment 2  weeks. The wound is located on the Left,Anterior Forearm. The wound measures 19cm length x 8.7cm width x 0.2cm depth; 129.826cm^2 area and 25.965cm^3 volume. There is no tunneling or undermining noted. There is a medium amount of serosanguineous drainage noted. There is medium (34-66%) red, pink granulation within the wound bed. There is a medium (34-66%) amount of necrotic tissue within the wound bed including Adherent Slough. The periwound skin appearance did not exhibit: Callus, Crepitus, Excoriation, Induration, Rash, Scarring, Dry/Scaly, Maceration, Atrophie Blanche, Cyanosis, Ecchymosis, Hemosiderin Staining, Mottled, Pallor, Rubor, Erythema. Assessment Active Problems ICD-10 Unspecified open wound of left upper arm, initial encounter Non-pressure chronic ulcer of skin of other sites with fat layer exposed Cellulitis of left upper limb Other psychoactive substance abuse, uncomplicated Patient's wound is stable. Unfortunately he has not been  able to do the Vashe wet-to-dry dressings until recently. I debrided nonviable tissue. I recommended continuing with Vashe wet-to-dry. His swelling, pain and increased warmth and erythema has improved with antibiotics. He still has some original visual symptoms. I recommended another week of antibiotics. He states he is scheduled to see Dr. Karie Schwalbe aylor at the end of the month. I do not see this scheduled. I will see him back in 2 weeks. Procedures Wound #1 Pre-procedure diagnosis of Wound #1 is an Infection - not elsewhere classified located on the Left,Anterior Forearm . There was a Selective/Open Wound Non- Viable Tissue Debridement with a total area of 129.76 sq cm performed by Geralyn Corwin, DO. With the following instrument(s): Curette to remove Viable and Non-Viable tissue/material. Material removed includes Slough after achieving pain control using Lidocaine. No specimens were taken. A time out was conducted at 10:00, prior to the start of the  procedure. A Minimum amount of bleeding was controlled with Pressure. The procedure was tolerated well with a pain level of 0 throughout and a pain level of 0 following the procedure. Post Debridement Measurements: 19cm length x 8.7cm width x 0.2cm depth; 25.965cm^3 volume. Character of Wound/Ulcer Post Debridement is improved. Post procedure Diagnosis Wound #1: Same as Pre-Procedure Plan Follow-up Appointments: Return Appointment in 2 weeks. - w/ Dr. Mikey Bussing and [place] Rm # 9 Thursday 03/07/23 @ 9:30 Other: - Plastic Surgery appt. in a couple of weeks. Be sure to make that appt. Anesthetic: (In clinic) Topical Lidocaine 5% applied to wound bed Bathing/ Shower/ Hygiene: May shower with protection but do not get wound dressing(s) wet. Protect dressing(s) with water repellant cover (for example, large plastic bag) or a cast cover and may then take shower. The following medication(s) was prescribed: amoxicillin-pot clavulanate oral 875 mg-125 mg tablet 1 1 tablet oral twice a day x 7 days starting 02/21/2023 WOUND #1: - Forearm Wound Laterality: Left, Anterior Cleanser: Vashe 5.8 (oz) 1 x Per Day/15 Days Discharge Instructions: Cleanse the wound with Vashe prior to applying a clean dressing using gauze sponges, not tissue or cotton balls. Cleanser: [employer]ram Ancillary Kit - 15 Day Supply (Generic) 1 x Per Day/15 Days Discharge Instructions: Use supplies as instructed; Kit contains: (15) Saline Bullets; (15) 3x3 Gauze; 15 pr Gloves Prim Dressing: vashe wet to dry 1 x Per Day/15 Days ary Discharge Instructions: moisten gauze with vashe Secondary Dressing: ABD Pad, 5x9 1 x Per Day/15 Days Discharge Instructions: Apply over primary dressing as directed. Secured With: [employer], 4.5x3.1 (in/yd) 1 x Per Day/15 Days Discharge Instructions: Secure with Kerlix as directed. Secured With: 29M Medipore H Soft Cloth Surgical T ape, 4 x 10 (in/yd) 1 x Per Day/15 Days Discharge Instructions: Secure with  tape as directed. Secured With: Stretch Net Size 5, 10 (yds) 1 x Per Day/15 Days 1. Vashe wet-to-dry dressings 2. Continue oral antibiotics 3. Follow-up in 2 weeks [name], [name] ([id number]) [phone].pdf Page 6 of 7 Electronic Signature(s) Signed: 02/21/2023 11:49:34 AM By: Geralyn Corwin DO Entered By: Geralyn Corwin on 02/21/2023 11:15:06 -------------------------------------------------------------------------------- HxROS Details Patient Name: Date of Service: [name] NA THA N 02/21/2023 9:15 A M Medical Record Number: [id number] Patient Account Number: [id number] Date of Birth/Sex: Treating RN: 1991-09-15 (32 y.o. M) Primary Care Provider: PA Zenovia Jordan, NO Other Clinician: Referring Provider: Treating Provider/Extender: Tilda Franco in Treatment: 2 Information Obtained From Patient Constitutional Symptoms (General Health) Medical History: Past Medical History Notes: methadone dependence hx drug addiction IV drug abuse Respiratory Medical History:  Positive for: Asthma Musculoskeletal Medical History: Past Medical History Notes: tendonitis Immunizations Pneumococcal Vaccine: Received Pneumococcal Vaccination: Yes Received Pneumococcal Vaccination On or After 60th Birthday: No Tetanus Vaccine: Last tetanus shot: 01/20/2021 Implantable Devices None Family and Social History Cancer: Yes - Father; Diabetes: No; Heart Disease: No; Hereditary Spherocytosis: No; Hypertension: Yes - Father; Kidney Disease: No; Lung Disease: No; Seizures: No; Stroke: No; Thyroid Problems: No; Tuberculosis: No; Current every day smoker - 1/2ppd - started on 01/20/1997; Marital Status - Single; Alcohol Use: Never; Drug Use: Prior History; Caffeine Use: Moderate; Financial Concerns: No; Food, Clothing or Shelter Needs: No; Support System Lacking: No; Transportation Concerns: No Electronic Signature(s) Signed: 02/21/2023 11:49:34 AM By: Geralyn Corwin  DO Entered By: Geralyn Corwin on 02/21/2023 11:09:24 -------------------------------------------------------------------------------- SuperBill Details Patient Name: Date of Service: [name] NA THA N 02/21/2023 Medical Record Number: [id number] Patient Account Number: [id number] Date of Birth/Sex: Treating RN: 05/13/1991 (32 y.o. [name] Primary Care Provider: PA Fidela [place] Other Clinician: Referring Provider: Treating Provider/Extender: Tilda Franco in Treatment: 2 Diagnosis Coding ICD-10 Codes [name], [name] ([id number]) [id number] Page 7 of 7 Code Description S41.102A Unspecified open wound of left upper arm, initial encounter L98.492 Non-pressure chronic ulcer of skin of other sites with fat layer exposed L03.114 Cellulitis of left upper limb F19.10 Other psychoactive substance abuse, uncomplicated Facility Procedures : CPT4 Code: [id number] Description: 97597 - DEBRIDE WOUND 1ST 20 SQ CM OR < ICD-10 Diagnosis Description L98.492 Non-pressure chronic ulcer of skin of other sites with fat layer exposed S41.102A Unspecified open wound of left upper arm, initial encounter Modifier: Quantity: 1 : CPT4 Code: 84696295 Description: 28413 - DEBRIDE WOUND EA ADDL 20 SQ CM ICD-10 Diagnosis Description L98.492 Non-pressure chronic ulcer of skin of other sites with fat layer exposed S41.102A Unspecified open wound of left upper arm, initial encounter Modifier: Quantity: 6 Physician Procedures : CPT4 Code Description Modifier [id number] 99213 - WC PHYS LEVEL 3 - EST PT 25 ICD-10 Diagnosis Description S41.102A Unspecified open wound of left upper arm, initial encounter L98.492 Non-pressure chronic ulcer of skin of other sites with fat layer  exposed L03.114 Cellulitis of left upper limb F19.10 Other psychoactive substance abuse, uncomplicated Quantity: 1 : [id number] 97597 - WC PHYS DEBR WO ANESTH 20 SQ CM ICD-10 Diagnosis Description L98.492  Non-pressure chronic ulcer of skin of other sites with fat layer exposed S41.102A Unspecified open wound of left upper arm, initial encounter Quantity: 1 : [id number] 97598 - WC PHYS DEBR WO ANESTH EA ADD 20 CM ICD-10 Diagnosis Description L98.492 Non-pressure chronic ulcer of skin of other sites with fat layer exposed S41.102A Unspecified open wound of left upper arm, initial encounter Quantity: 6 Electronic Signature(s) Signed: 02/21/2023 11:49:34 AM By: Geralyn Corwin DO Entered By: Geralyn Corwin on 02/21/2023 11:15:37

## 2023-02-27 ENCOUNTER — Ambulatory Visit (HOSPITAL_BASED_OUTPATIENT_CLINIC_OR_DEPARTMENT_OTHER): Payer: Self-pay | Admitting: General Surgery

## 2023-03-07 ENCOUNTER — Encounter (HOSPITAL_BASED_OUTPATIENT_CLINIC_OR_DEPARTMENT_OTHER): Payer: Medicaid Other | Admitting: Internal Medicine

## 2023-03-07 DIAGNOSIS — S41102A Unspecified open wound of left upper arm, initial encounter: Secondary | ICD-10-CM

## 2023-03-07 DIAGNOSIS — L98492 Non-pressure chronic ulcer of skin of other sites with fat layer exposed: Secondary | ICD-10-CM

## 2023-04-03 ENCOUNTER — Institutional Professional Consult (permissible substitution): Payer: Medicaid Other | Admitting: Plastic Surgery

## 2023-04-18 ENCOUNTER — Encounter (HOSPITAL_BASED_OUTPATIENT_CLINIC_OR_DEPARTMENT_OTHER): Payer: Medicaid Other | Attending: Internal Medicine | Admitting: Internal Medicine

## 2023-11-05 ENCOUNTER — Ambulatory Visit
Admission: EM | Admit: 2023-11-05 | Discharge: 2023-11-05 | Disposition: A | Discharge status: Home / Self Care | Payer: MEDICAID | Attending: Family Medicine | Admitting: Family Medicine

## 2023-11-05 DIAGNOSIS — J209 Acute bronchitis, unspecified: Secondary | ICD-10-CM | POA: Diagnosis not present

## 2023-11-05 DIAGNOSIS — U071 COVID-19: Secondary | ICD-10-CM

## 2023-11-05 DIAGNOSIS — J208 Acute bronchitis due to other specified organisms: Secondary | ICD-10-CM

## 2023-11-05 DIAGNOSIS — J4540 Moderate persistent asthma, uncomplicated: Secondary | ICD-10-CM

## 2023-11-05 HISTORY — DX: Anemia, unspecified: D64.9

## 2023-11-05 LAB — POC COVID19/FLU A&B COMBO
Covid Antigen, POC: POSITIVE — AB
Influenza A Antigen, POC: NEGATIVE
Influenza B Antigen, POC: NEGATIVE

## 2023-11-05 MED ORDER — ALBUTEROL SULFATE HFA 108 (90 BASE) MCG/ACT IN AERS: 1.0000 | INHALATION_SPRAY | Freq: Four times a day (QID) | RESPIRATORY_TRACT | 0 refills | Status: AC | PRN

## 2023-11-05 MED ORDER — PREDNISONE 20 MG PO TABS: ORAL_TABLET | ORAL | 0 refills | Status: AC

## 2023-11-05 MED ORDER — PAXLOVID (300/100) 20 X 150 MG & 10 X 100MG PO TBPK: ORAL_TABLET | ORAL | 0 refills | Status: AC

## 2023-11-05 MED ORDER — PROMETHAZINE-DM 6.25-15 MG/5ML PO SYRP: 5.0000 mL | ORAL_SOLUTION | Freq: Three times a day (TID) | ORAL | 0 refills | Status: AC | PRN

## 2023-11-05 NOTE — ED Triage Notes (Signed)
 Pt c/o cough, fever, body aches x 24 hours-NAD-steady gait

## 2023-11-05 NOTE — ED Provider Notes (Signed)
 [facility]  Note:  This document was prepared using Conservation officer, historic buildings and may include unintentional dictation errors.  MRN: [id number] DOB: 08-27-1991  Subjective:   [name] is a 33 y.o. male presenting for 1 day history of acute onset of fever, body pains, coughing, chest tightness and shortness of breath.  Needs a refill of his albuterol  inhaler.  Would like a COVID and flu test.  Patient is a smoker.  No current facility-administered medications for this encounter.  Current Outpatient Medications:    albuterol  (PROVENTIL ) (2.5 MG/3ML) 0.083% nebulizer solution, Take 3 mLs (2.5 mg total) by nebulization every 6 (six) hours as needed for wheezing or shortness of breath., Disp: 75 mL, Rfl: 0   albuterol  (VENTOLIN  HFA) 108 (90 Base) MCG/ACT inhaler, Inhale 2 puffs into the lungs every 6 (six) hours as needed for wheezing or shortness of breath., Disp: 18 g, Rfl: 0   albuterol  (VENTOLIN  HFA) 108 (90 Base) MCG/ACT inhaler, Inhale 1 puff into the lungs every 4 (four) hours., Disp: 18 g, Rfl: 0   Desvenlafaxine ER (PRISTIQ) 50 MG TB24, Take 1 tablet by mouth every morning., Disp: , Rfl:    furosemide  (LASIX ) 40 MG tablet, Take 1 tablet (40 mg total) by mouth daily., Disp: 30 tablet, Rfl: 0   guaiFENesin  (ROBITUSSIN) 100 MG/5ML SOLN, Take 5 mLs (100 mg total) by mouth every 4 (four) hours., Disp: 236 mL, Rfl: 0   hydrOXYzine  (ATARAX /VISTARIL ) 25 MG tablet, Take 1 tablet (25 mg total) by mouth 3 (three) times daily as needed for anxiety., Disp: 30 tablet, Rfl: 0   ipratropium (ATROVENT ) 0.02 % nebulizer solution, Take 2.5 mLs (0.5 mg total) by nebulization every 6 (six) hours as needed for wheezing or shortness of breath., Disp: 75 mL, Rfl: 0   methadone  (DOLOPHINE ) 1 mg/ml oral solution, Take 120 mg/kg by mouth daily. , Disp: , Rfl:    mirtazapine (REMERON) 7.5 MG tablet, Take by mouth., Disp: , Rfl:    potassium chloride  SA (KLOR-CON  M) 20 MEQ  tablet, Take 1 tablet (20 mEq total) by mouth daily., Disp: 30 tablet, Rfl: 0   sertraline  (ZOLOFT ) 50 MG tablet, Take 50 mg by mouth daily., Disp: , Rfl:    Allergies  Allergen Reactions   Vicodin [Hydrocodone-Acetaminophen ] Other (See Comments)    AVOID PAIN KILLERS OR ANY OTHER MEDS THAT MAY CAUSE ADDICTION   Xanax [Alprazolam] Other (See Comments)    AVOID BENZOS OR ANY OTHER MEDS THAT MAY CAUSE ADDICTION    Past Medical History:  Diagnosis Date   Anemia    per pt   Asthma    Methadone  dependence (HCC)    Tendonitis      Past Surgical History:  Procedure Laterality Date   INCISION AND DRAINAGE OF WOUND  08/12/2012   Procedure: IRRIGATION AND DEBRIDEMENT WOUND;  Surgeon: Franky JONELLE Curia, MD;  Location:  SUR[facility]ERY CENTER;  Service: Orthopedics;  Laterality: Right;   WISDOM TOOTH EXTRACTION      No family history on file.  Social History   Tobacco Use   Smoking status: Every Day    Current packs/day: 0.50    Average packs/day: 0.5 packs/day for 5.0 years (2.5 ttl pk-yrs)    Types: Cigarettes   Smokeless tobacco: Never  Vaping Use   Vaping status: Never Used  Substance Use Topics   Alcohol use: Not Currently   Drug use: Not Currently    Types: Heroin, IV, Cocaine  Comment: on methadone  program started ~ 03-2011      ROS   Objective:   Vitals: BP 105/67 (BP Location: Right Arm)   Pulse 85   Temp 98.6 F (37 C) (Oral)   Resp 20   SpO2 95%   Physical Exam Constitutional:      General: He is not in acute distress.    Appearance: Normal appearance. He is well-developed and normal weight. He is not ill-appearing, toxic-appearing or diaphoretic.  HENT:     Head: Normocephalic and atraumatic.     Right Ear: Tympanic membrane, ear canal and external ear normal. No drainage, swelling or tenderness. No middle ear effusion. There is no impacted cerumen. Tympanic membrane is not erythematous or bulging.     Left Ear: Tympanic membrane, ear canal and  external ear normal. No drainage, swelling or tenderness.  No middle ear effusion. There is no impacted cerumen. Tympanic membrane is not erythematous or bulging.     Nose: Nose normal. No congestion or rhinorrhea.     Mouth/Throat:     Mouth: Mucous membranes are moist.     Pharynx: No oropharyngeal exudate or posterior oropharyngeal erythema.  Eyes:     General: No scleral icterus.       Right eye: No discharge.        Left eye: No discharge.     Extraocular Movements: Extraocular movements intact.     Conjunctiva/sclera: Conjunctivae normal.  Cardiovascular:     Rate and Rhythm: Normal rate and regular rhythm.     Heart sounds: Normal heart sounds. No murmur heard.    No friction rub. No gallop.  Pulmonary:     Effort: Pulmonary effort is normal. No respiratory distress.     Breath sounds: Normal breath sounds. No stridor. No wheezing, rhonchi or rales.  Musculoskeletal:     Cervical back: Normal range of motion and neck supple. No rigidity. No muscular tenderness.  Neurological:     General: No focal deficit present.     Mental Status: He is alert and oriented to person, place, and time.  Psychiatric:        Mood and Affect: Mood normal.        Behavior: Behavior normal.        Thought Content: Thought content normal.    Results for orders placed or performed during the hospital encounter of 11/05/23 (from the past 24 hours)  POC Covid19/Flu A&B Antigen     Status: Abnormal   Collection Time: 11/05/23  2:30 PM  Result Value Ref Range   Influenza A Antigen, POC Negative    Influenza B Antigen, POC Negative    Covid Antigen, POC Positive (A)      Assessment and Plan :   PDMP not reviewed this encounter.  1. Acute bronchitis due to COVID-19 virus   2. Moderate persistent asthma without complication    Start Paxlovid  for COVID 19 infection. Recommended oral prednisone  course in the context of his asthma and his respiratory symptoms.  Refilled his albuterol  inhaler.   Counseled patient on potential for adverse effects with medications prescribed/recommended today, ER and return-to-clinic precautions discussed, patient verbalized understanding.    Christopher Savannah, [place] 11/05/23 1437

## 2024-01-06 ENCOUNTER — Ambulatory Visit (HOSPITAL_BASED_OUTPATIENT_CLINIC_OR_DEPARTMENT_OTHER): Payer: MEDICAID | Admitting: Internal Medicine

## 2024-04-05 ENCOUNTER — Other Ambulatory Visit: Payer: Self-pay

## 2024-04-05 ENCOUNTER — Ambulatory Visit (INDEPENDENT_AMBULATORY_CARE_PROVIDER_SITE_OTHER): Payer: MEDICAID

## 2024-04-05 ENCOUNTER — Ambulatory Visit
Admission: EM | Admit: 2024-04-05 | Discharge: 2024-04-05 | Disposition: A | Discharge status: Home / Self Care | Payer: MEDICAID | Attending: Emergency Medicine | Admitting: Emergency Medicine

## 2024-04-05 ENCOUNTER — Emergency Department (HOSPITAL_BASED_OUTPATIENT_CLINIC_OR_DEPARTMENT_OTHER)
Admission: EM | Admit: 2024-04-05 | Discharge: 2024-04-05 | Disposition: A | Discharge status: Home / Self Care | Payer: MEDICAID | Attending: Emergency Medicine | Admitting: Emergency Medicine

## 2024-04-05 ENCOUNTER — Encounter (HOSPITAL_BASED_OUTPATIENT_CLINIC_OR_DEPARTMENT_OTHER): Payer: Self-pay | Admitting: Emergency Medicine

## 2024-04-05 DIAGNOSIS — T148XXA Other injury of unspecified body region, initial encounter: Secondary | ICD-10-CM

## 2024-04-05 DIAGNOSIS — S6992XA Unspecified injury of left wrist, hand and finger(s), initial encounter: Secondary | ICD-10-CM | POA: Insufficient documentation

## 2024-04-05 DIAGNOSIS — Y93E6 Activity, residential relocation: Secondary | ICD-10-CM | POA: Insufficient documentation

## 2024-04-05 DIAGNOSIS — M25532 Pain in left wrist: Secondary | ICD-10-CM

## 2024-04-05 DIAGNOSIS — M869 Osteomyelitis, unspecified: Secondary | ICD-10-CM

## 2024-04-05 DIAGNOSIS — S51802S Unspecified open wound of left forearm, sequela: Secondary | ICD-10-CM | POA: Diagnosis not present

## 2024-04-05 DIAGNOSIS — W19XXXA Unspecified fall, initial encounter: Secondary | ICD-10-CM

## 2024-04-05 DIAGNOSIS — Y990 Civilian activity done for income or pay: Secondary | ICD-10-CM | POA: Insufficient documentation

## 2024-04-05 MED ORDER — BACITRACIN ZINC 500 UNIT/GM EX OINT
TOPICAL_OINTMENT | Freq: Once | CUTANEOUS | Status: DC
  Filled 2024-04-05: qty 28.35

## 2024-04-05 NOTE — ED Triage Notes (Signed)
 Just left UC , had fall yesterday . Left wrist injury , negative for fracture . Sent here for further evaluation for possible osteomyelitis ,

## 2024-04-05 NOTE — ED Triage Notes (Signed)
 Pt present with lt wrist pain and swelling. States he fell out of a truck at work yesterday and woke up today with worsening pain.   Home interventions: Ibuprofen 

## 2024-04-05 NOTE — Discharge Instructions (Addendum)
 Sent to ED

## 2024-04-05 NOTE — Discharge Instructions (Signed)
 Please read and follow all provided instructions.  Your diagnoses today include:  1. Left wrist pain   2. Fall, initial encounter   3. Chronic wound     Tests performed today include: An x-ray of your wrist - does NOT show any broken bones Vital signs. See below for your results today.   Medications prescribed:  Please use over-the-counter NSAID medications (ibuprofen , naproxen) or Tylenol  (acetaminophen ) as directed on the packaging for pain -- as long as you do not have any reasons avoid these medications. Reasons to avoid NSAID medications include: weak kidneys, a history of bleeding in your stomach or gut, or uncontrolled high blood pressure or previous heart attack. Reasons to avoid Tylenol  include: liver problems or ongoing alcohol use. Never take more than 4000mg  or 8 Extra strength Tylenol  in a 24 hour period.     Take any prescribed medications only as directed.  Home care instructions:  Follow any educational materials contained in this packet Wear your splint for at least one week or until seen by a physician for a follow-up examination. Follow R.I.C.E. Protocol: R - rest your injury  I  - use ice on injury without applying directly to skin C - compress injury with bandage or splint E - elevate the injury above the level of your heart as much as possible to reduce pain and swelling  Follow-up instructions: Please follow-up with your primary care provider or the provided orthopedic (bone specialist) if you continue to have significant pain or trouble using your wrist in 1 week. In this case you may have a severe injury that requires further care.   Generally, when wrists are moderately tender to touch following a fall or injury, a fracture (break in bone) may be present. Because of this, even if your x-rays were normal today, it is important that you receive follow-up care as suggested (you could still have a broken bone).  Return instructions:  Please return if your fingers  are numb or tingling, appear very red, white, gray or blue, or you have severe pain (also elevate wrist and loosen splint or wrap) Please return if you have difficulty moving your fingers. Please return to the Emergency Department if you experience worsening symptoms.  Please return if you have any other emergent concerns.  Additional Information:  Your vital signs today were: BP 125/80 (BP Location: Right Arm)   Pulse (!) 58   Temp 98 F (36.7 C)   Resp 15   Wt 90.7 kg   SpO2 97%   BMI 25.00 kg/m  If your blood pressure (BP) was elevated above 135/85 this visit, please have this repeated by your doctor within one month. -------------- Wrist injuries are frequent in adults and children. A sprain is an injury to the ligaments that hold your bones together. A strain is an injury to muscle or muscle tendons (cord like structure) from stretching or pulling.   Remember the importance of follow-up and possible follow-up x-rays. Improvement in pain level is not 100% insurance of not having a fracture. --------------

## 2024-04-05 NOTE — ED Provider Notes (Signed)
 [facility]    CSN: [id number] Arrival date & time: 04/05/24  9147      History   Chief Complaint Chief Complaint  Patient presents with   Wrist Injury    HPI [name] is a 33 y.o. male.  [name] out of a truck yesterday and landed on left wrist pain. Was not bothering him until this morning. Pain is now 10/10 especially with movement Took ibuprofen   History of IV drug use. Reports he stopped using. He's had several infections in the left arm.  Was seen in the ED 10/2021 for wound infection, see photo in media tab. Was referred to wound care but missed all of his appointments.  Reports the area has been healing and he cleans it daily.   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 Authorizing Provider  albuterol  (PROVENTIL ) (2.5 MG/3ML) 0.083% nebulizer solution Take 3 mLs (2.5 mg total) by nebulization every 6 (six) hours as needed for wheezing or shortness of breath. 06/23/19   Kraig Peru, MD  albuterol  (VENTOLIN  HFA) 108 [phone]  Base) MCG/ACT inhaler Inhale 1-2 puffs into the lungs every 6 (six) hours as needed for wheezing or shortness of breath. 11/05/23   Adolph Hoop, PA-C  Desvenlafaxine ER (PRISTIQ) 50 MG TB24 Take 1 tablet by mouth every morning. 05/03/22   [provider]  furosemide  (LASIX ) 40 MG tablet Take 1 tablet (40 mg total) by mouth daily. 05/13/22   Corine Dice, MD  guaiFENesin  (ROBITUSSIN) 100 MG/5ML SOLN Take 5 mLs (100 mg total) by mouth every 4 (four) hours. 09/02/19   Amin, Sumayya, MD  hydrOXYzine  (ATARAX /VISTARIL ) 25 MG tablet Take 1 tablet (25 mg total) by mouth 3 (three) times daily as needed for anxiety. 06/23/19   Patel, Pranav M, MD  ipratropium (ATROVENT ) 0.02 % nebulizer solution Take 2.5 mLs (0.5 mg total) by nebulization every 6 (six) hours as needed for wheezing or shortness of breath. 06/23/19   Kraig Peru, MD  methadone  (DOLOPHINE ) 1 mg/ml oral solution Take 120 mg/kg by mouth daily.     [provider]  mirtazapine (REMERON) 7.5 MG tablet Take by mouth. 04/19/22   [provider]  nirmatrelvir/ritonavir (PAXLOVID , 300/100,) 20 x 150 MG & 10 x 100MG  TBPK Take 2 tablets nirmtrelvir and 1 tablet ritonavir twice daily. 11/05/23   Adolph Hoop, PA-C  potassium chloride  SA (KLOR-CON  M) 20 MEQ tablet Take 1 tablet (20  mEq total) by mouth daily. 05/13/22 06/12/22  Corine Dice, MD  predniSONE  (DELTASONE ) 20 MG tablet Take 2 tablets daily with breakfast. 11/05/23   Adolph Hoop, PA-C  promethazine -dextromethorphan  (PROMETHAZINE -DM) 6.25-15 MG/5ML syrup Take 5 mLs by mouth 3 (three) times daily as needed for cough. 11/05/23   Adolph Hoop, PA-C  sertraline  (ZOLOFT ) 50 MG tablet Take 50 mg by mouth daily.    [provider]    Family History History reviewed. No pertinent family history.  Social History Social History   Tobacco Use   Smoking status: Every Day    Current packs/day: 0.50    Average packs/day: 0.5 packs/day for 5.0 years (2.5 ttl pk-yrs)     Types: Cigarettes   Smokeless tobacco: Never  Vaping Use   Vaping status: Never Used  Substance Use Topics   Alcohol use: Not Currently   Drug use: Not Currently    Types: Heroin, IV, Cocaine    Comment: on methadone  program started ~ 03-2011       Allergies   Vicodin [hydrocodone-acetaminophen ] and Xanax [alprazolam]   Review of Systems Review of Systems As per HPI  Physical Exam Triage Vital Signs ED Triage Vitals  Encounter Vitals Group     BP 04/05/24 1050 122/82     Girls Systolic BP Percentile --      Girls Diastolic BP Percentile --      Boys Systolic BP Percentile --      Boys Diastolic BP Percentile --      Pulse Rate 04/05/24 1050 74     Resp 04/05/24 1050 17     Temp 04/05/24 1050 98.3 F (36.8 C)     Temp src --      SpO2 04/05/24 1050 96 %     Weight --      Height --      Head Circumference --      Peak Flow --      Pain Score 04/05/24 1048 10     Pain Loc --      Pain Education --      Exclude from Growth Chart --    No data found.  Updated Vital Signs BP 119/74   Pulse 74   Temp 98.3 F (36.8 C)   Resp 17   SpO2 96%   Visual Acuity Right Eye Distance:   Left Eye Distance:   Bilateral Distance:    Right Eye Near:   Left Eye Near:    Bilateral Near:     Physical Exam Vitals and nursing note reviewed.  Constitutional:      General: He is not in acute distress. HENT:     Mouth/Throat:     Pharynx: Oropharynx is clear.   Cardiovascular:     Rate and Rhythm: Normal rate and regular rhythm.     Pulses: Normal pulses.     Heart sounds: Normal heart sounds.  Pulmonary:     Effort: Pulmonary effort is normal.     Breath sounds: Normal breath sounds.   Musculoskeletal:     Left forearm: Swelling and tenderness present.     Left wrist: Swelling present.     Comments: Swelling and tenderness of left wrist and forearm. Reduced ROM at wrist due to pain. Grip strength intact. Cap refill < 2 seconds and distal sensation intact each  finger. Radial pulse is 2+   Skin:    General: Skin is warm and dry.     Capillary Refill: Capillary refill takes  less than 2 seconds.     Findings: Wound present.     Comments: Large wound on left forearm. Yellow drainage. See photo   Neurological:     Mental Status: He is alert and oriented to person, place, and time.      UC Treatments / Results  Labs (all labs ordered are listed, but only abnormal results are displayed) Labs Reviewed - No data to display  EKG   Radiology DG Wrist Complete Left Result Date: 04/05/2024 CLINICAL DATA:  [name] at work.  Hurt left wrist.  Swelling. EXAM: LEFT WRIST - COMPLETE 3+ VIEW COMPARISON:  Left forearm radiographs 10/24/2021 FINDINGS: Neutral ulnar variance. The wrist joint spaces are maintained. No acute fracture is seen. No dislocation. There is increased moderate cortical thickening and irregularity of the medial greater than lateral ulnar shaft cortices on frontal view as well as the distal radial diaphyseal metaphyseal medial cortex on frontal view. On lateral view there is higher grade cortical thickening measuring up to approximately 9 mm in the AP dimension. Note is made of irregularity and attenuation of the majority of the dorsal forearm soft tissues of a large soft tissue wound in this region on prior 10/24/2021 radiographs. The current cortical thickening findings presumably represent sequela of prior likely now chronic/remote osteomyelitis. IMPRESSION: 1. No acute fracture is seen. 2. There is increased moderate cortical thickening and irregularity of the ulnar diaphyseal cortex. Presumably this represents the sequela of now remote osteomyelitis given there was a wound in this same region of the forearm 10/24/2021 prior radiographs. Electronically Signed   By: Bertina Broccoli M.D.   On: 04/05/2024 11:56    Procedures Procedures (including critical care time)  Medications Ordered in UC Medications - No data to display  Initial  Impression / Assessment and Plan / UC Course  I have reviewed the triage vital signs and the nursing notes.  Pertinent labs & imaging results that were available during my care of the patient were reviewed by me and considered in my medical decision making (see chart for details).  He is currently afebrile, vitals are stable.   Unfortunately he initially presented for wrist injury and a wrist xray was obtained as a standing order. However on my assessment he was wearing a compression sleeve and I asked to remove it to check the skin. Forearm wound as per photo.   When removing the initial dressing there is fungus appearing material on the gauze. He reports this dressing was applied 2 days ago.  Wound does appear slightly improved since 2 years ago. However on xray today there are abnormalities of ulnar bone, I have concern for osteomyelitis. Awaiting radiology final read for confirmation These bony changes are new findings from xray 2 years ago  Radiology final read as possible chronic/remote osteomyelitis, they make note of wound from 2023. However patient does still have active infected wound at this time. I have sent him to the emergency department for higher level of care and further treatment.   I have reapplied a temporary dressing to the arm. Patient also requests I wrap the wrist. Did discuss no broken bone in the wrist (patient initial concern)  Final Clinical Impressions(s) / UC Diagnoses   Final diagnoses:  Osteomyelitis of left ulna, unspecified type [facility])     Discharge Instructions      Sent to ED     ED Prescriptions   None    PDMP not reviewed this encounter.   Taiwo Fish, Beth Brooke 04/05/24 1208

## 2024-04-05 NOTE — ED Provider Notes (Signed)
 [facility] Provider Note   CSN: [id number] Arrival date & time: 04/05/24  1235     Patient presents with: Wrist Pain (left)   [name] is a 33 y.o. male.   Patient presents to the emergency department after being seen in urgent care today, for evaluation of left wrist injury.  Patient is a Special educational needs teacher.  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  Several mattresses slid and knocked him off of the truck onto the ground.  He landed on an outstretched left hand.  Patient reports severe pain with movement of the wrist especially on the ulnar aspect.  He has taken over-the-counter medications without improvement.  Patient presented to urgent care.  He had x-ray there.  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  This wound has been present since prior to 2023.  X-ray at urgent care showed sequelae of prior osteomyelitis.  X-ray was negative for fracture.  Patient denies head or neck injury.  Patient actually reports general improvement of the wound since he stopped injecting.  He denies fevers, significant purulent drainage, increased bleeding, streaking redness up the arm.       Prior to Admission medications   Medication Sig Start Date End Date Taking? Authorizing Provider  albuterol  (PROVENTIL ) (2.5 MG/3ML) 0.083% nebulizer solution Take 3 mLs (2.5 mg total) by nebulization every 6 (six) hours as needed for wheezing or shortness of breath. 06/23/19   Kraig Peru, MD  albuterol  (VENTOLIN  HFA) [phone] Base) MCG/ACT inhaler Inhale 1-2 puffs into the lungs every 6 (six) hours as needed for wheezing or shortness of breath. 11/05/23   Adolph Hoop, PA-C  Desvenlafaxine ER (PRISTIQ) 50 MG TB24 Take 1 tablet by mouth every morning. 05/03/22   [provider]  furosemide  (LASIX ) 40 MG tablet Take 1 tablet (40 mg  total) by mouth daily. 05/13/22   Corine Dice, MD  guaiFENesin  (ROBITUSSIN) 100 MG/5ML SOLN Take 5 mLs (100 mg total) by mouth every 4 (four) hours. 09/02/19   Amin, Sumayya, MD  hydrOXYzine  (ATARAX /VISTARIL ) 25 MG tablet Take 1 tablet (25 mg total) by mouth 3 (three) times daily as needed for anxiety. 06/23/19   Patel, Pranav M, MD  ipratropium (ATROVENT ) 0.02 % nebulizer solution Take 2.5 mLs (0.5 mg total) by nebulization every 6 (six) hours as needed for wheezing or shortness of breath. 06/23/19   Kraig Peru, MD  methadone  (DOLOPHINE ) 1 mg/ml oral solution Take 120 mg/kg by mouth daily.     [provider]  mirtazapine (REMERON) 7.5 MG tablet Take by mouth. 04/19/22   [provider]  nirmatrelvir/ritonavir (PAXLOVID , 300/100,) 20 x 150 MG & 10 x 100MG  TBPK Take 2 tablets nirmtrelvir and 1 tablet ritonavir twice daily. 11/05/23   Adolph Hoop, PA-C  potassium chloride  SA (KLOR-CON  M) 20 MEQ tablet Take 1 tablet (20 mEq total) by mouth daily. 05/13/22 06/12/22  Corine Dice, MD  predniSONE  (DELTASONE ) 20 MG tablet Take 2 tablets daily with breakfast. 11/05/23   Adolph Hoop, PA-C  promethazine -dextromethorphan  (PROMETHAZINE -DM) 6.25-15 MG/5ML syrup Take 5 mLs by mouth 3 (three) times daily as needed for cough. 11/05/23   Adolph Hoop, PA-C  sertraline  (ZOLOFT ) 50 MG tablet Take 50 mg by mouth daily.    [provider]    Allergies: Vicodin [hydrocodone-acetaminophen ]  and Xanax [alprazolam]    Review of Systems  Updated Vital Signs BP 125/80 (BP Location: Right Arm)   Pulse (!) 58   Temp 98 F (36.7 C)   Resp 15   Wt 90.7 kg   SpO2 97%   BMI 25.00 kg/m   Physical Exam Vitals and nursing note reviewed.  Constitutional:      Appearance: He is well-developed.  HENT:     Head: Normocephalic and atraumatic.   Eyes:     Conjunctiva/sclera: Conjunctivae normal.   Pulmonary:     Effort: No respiratory distress.   Musculoskeletal:     Left forearm:  Swelling present.     Left wrist: Tenderness and bony tenderness (Tender to palpation over the distal ulna) present. Decreased range of motion. Normal pulse.     Cervical back: Normal range of motion and neck supple.     Comments: Patient with large superficial wound (see urgent care photo).  There is significant granulation tissue, mild venous oozing in several places after removal of dressing.  No lymphangitis.  It appears to involve the subcutaneous tissue only.   Skin:    General: Skin is warm and dry.   Neurological:     Mental Status: He is alert.      (all labs ordered are listed, but only abnormal results are displayed) Labs Reviewed - No data to display  EKG: None  Radiology: DG Wrist Complete Left Result Date: 04/05/2024 CLINICAL DATA:  Marvell Slider at work.  Hurt left wrist.  Swelling. EXAM: LEFT WRIST - COMPLETE 3+ VIEW COMPARISON:  Left forearm radiographs 10/24/2021 FINDINGS: Neutral ulnar variance. The wrist joint spaces are maintained. No acute fracture is seen. No dislocation. There is increased moderate cortical thickening and irregularity of the medial greater than lateral ulnar shaft cortices on frontal view as well as the distal radial diaphyseal metaphyseal medial cortex on frontal view. On lateral view there is higher grade cortical thickening measuring up to approximately 9 mm in the AP dimension. Note is made of irregularity and attenuation of the majority of the dorsal forearm soft tissues of a large soft tissue wound in this region on prior 10/24/2021 radiographs. The current cortical thickening findings presumably represent sequela of prior likely now chronic/remote osteomyelitis. IMPRESSION: 1. No acute fracture is seen. 2. There is increased moderate cortical thickening and irregularity of the ulnar diaphyseal cortex. Presumably this represents the sequela of now remote osteomyelitis given there was a wound in this same region of the forearm 10/24/2021 prior radiographs.  Electronically Signed   By: Bertina Broccoli M.D.   On: 04/05/2024 11:56     Procedures   Medications Ordered in the ED  bacitracin ointment (has no administration in time range)   Patient seen and examined. History obtained directly from patient.  Reviewed previous workup and photos from 2023.  Reviewed urgent care notes.  Reviewed x-ray ordered at previous visit.  Labs/EKG: None ordered  Imaging: Independently reviewed and interpreted x-rays performed at urgent care.  Degree no fracture.  Agree sequelae of previous osteomyelitis.  Medications/Fluids: None ordered  Most recent vital signs reviewed and are as follows: BP 125/80 (BP Location: Right Arm)   Pulse (!) 58   Temp 98 F (36.7 C)   Resp 15   Wt 90.7 kg   SpO2 97%   BMI 25.00 kg/m   Initial impression: Wrist sprain and pain after fall yesterday, will provide with splint for comfort.  As far as the wound that is present,  per patient history, there is no evidence of worsening.  Patient reports steady improvement over the past several months.  No fevers.  Low clinical concern for acute osteomyelitis at this time.  Do not feel that patient requires transfer for advanced imaging or admission at this time.  Will encourage him to follow-up with orthopedics.  Home treatment plan: RICE protocol, OTC meds  Return instructions discussed with patient: Worsening signs of infection, purulent drainage, streaking redness, increasing pain.  Follow-up instructions discussed with patient: Follow-up with orthopedics in 1 week, especially if pain is persistent in the wrist.                                  Medical Decision Making Risk OTC drugs.   Patient with left wrist pain after a fall, x-rays negative for fracture.  Chronic wound discussion as above.  Low concern for acute osteomyelitis or need for advanced imaging or admission.  Patient appears well, nontoxic.  No concerns for sepsis.  Patient with open wound, granulating,  hopefully will continue to heal.     Final diagnoses:  Left wrist pain  Fall, initial encounter  Chronic wound    ED Discharge Orders     None          Lyna Sandhoff, PA-C 04/05/24 1429    Mozell Arias, MD 04/05/24 1438
